# Patient Record
Sex: Female | Born: 1960
Health system: Southern US, Community
[De-identification: ages and names within clinical notes are randomized; demographics above are authoritative.]

## PROBLEM LIST (undated history)

## (undated) DIAGNOSIS — G43909 Migraine, unspecified, not intractable, without status migrainosus: Secondary | ICD-10-CM

## (undated) DIAGNOSIS — G44009 Cluster headache syndrome, unspecified, not intractable: Secondary | ICD-10-CM

## (undated) DIAGNOSIS — S83419A Sprain of medial collateral ligament of unspecified knee, initial encounter: Secondary | ICD-10-CM

## (undated) HISTORY — PX: ADENOIDECTOMY: SUR15

## (undated) HISTORY — PX: TONSILLECTOMY: SUR1361

## (undated) HISTORY — PX: OTHER SURGICAL HISTORY: SHX169

## (undated) HISTORY — DX: Cluster headache syndrome, unspecified, not intractable: G44.009

## (undated) HISTORY — DX: Migraine, unspecified, not intractable, without status migrainosus: G43.909

## (undated) HISTORY — PX: MOUTH SURGERY: SHX715

## (undated) HISTORY — PX: GALLBLADDER SURGERY: SHX652

## (undated) HISTORY — PX: CHOLECYSTECTOMY: SHX55

---

## 1998-12-18 ENCOUNTER — Encounter: Payer: Self-pay | Admitting: *Deleted

## 1998-12-18 ENCOUNTER — Encounter: Admission: RE | Admit: 1998-12-18 | Discharge: 1998-12-18 | Payer: Self-pay | Admitting: *Deleted

## 2007-08-13 ENCOUNTER — Emergency Department (HOSPITAL_COMMUNITY): Admission: EM | Admit: 2007-08-13 | Discharge: 2007-08-13 | Payer: Self-pay | Admitting: Emergency Medicine

## 2009-12-05 ENCOUNTER — Ambulatory Visit (HOSPITAL_COMMUNITY): Admission: RE | Admit: 2009-12-05 | Discharge: 2009-12-05 | Payer: Self-pay | Admitting: Urology

## 2010-01-06 ENCOUNTER — Ambulatory Visit (HOSPITAL_COMMUNITY)
Admission: RE | Admit: 2010-01-06 | Discharge: 2010-01-06 | Payer: Self-pay | Source: Home / Self Care | Attending: Internal Medicine | Admitting: Internal Medicine

## 2010-08-26 ENCOUNTER — Encounter: Payer: Self-pay | Admitting: Podiatrist

## 2010-08-26 DIAGNOSIS — M722 Plantar fascial fibromatosis: Secondary | ICD-10-CM | POA: Insufficient documentation

## 2010-08-26 DIAGNOSIS — G43909 Migraine, unspecified, not intractable, without status migrainosus: Secondary | ICD-10-CM | POA: Insufficient documentation

## 2011-08-28 ENCOUNTER — Emergency Department (HOSPITAL_COMMUNITY)
Admission: EM | Admit: 2011-08-28 | Discharge: 2011-08-29 | Disposition: A | Discharge status: Home Health | Payer: 59 | Attending: Emergency Medicine | Admitting: Emergency Medicine

## 2011-08-28 DIAGNOSIS — N390 Urinary tract infection, site not specified: Secondary | ICD-10-CM | POA: Insufficient documentation

## 2011-08-28 NOTE — ED Notes (Signed)
Pt states the last time she urinated was around 3 pm today. Pt denies any past medical hx of urinary complications.

## 2011-08-28 NOTE — ED Notes (Signed)
RN at bedside to perform bladder scan. Pt with only 64 cc of urine.

## 2011-08-28 NOTE — ED Provider Notes (Signed)
History     CSN: [id number]  Arrival date & time 08/28/11  2221   First MD Initiated Contact with Patient 08/28/11 2330      Chief Complaint  Patient presents with  . Urinary Retention  . Flank Pain    (Consider location/radiation/quality/duration/timing/severity/associated sxs/prior treatment) Patient is a 51 y.o. female presenting with flank pain. The history is provided by the patient.  Flank Pain  She states that she went to work this morning and went through her entire shift without urinating. This is somewhat unusual for her. Where should send it at 3 PM, she was able to urinate but it did not seem like it was a lot. Since then, she has had a sense that she has urinated has not been able to pass any urine. She's also developed intermittent, sharp, right suprapubic pain with some radiation to the right flank. There is no associated nausea, vomiting, diarrhea. She's not had any fever, chills, sweats. She did have a urinary tract infection one year ago but symptoms were just different. That time she had hematuria and not difficulty passing urine.  Past Medical History  Diagnosis Date  . Headaches, cluster   . Migraines     Past Surgical History  Procedure Date  . Gallbladder surgery   . Rotator cuff surgery   . Tonsillectomy   . Adenoidectomy   . Mouth surgery     No family history on file.  History  Substance Use Topics  . Smoking status: Unknown If Ever Smoked  . Smokeless tobacco: Not on file  . Alcohol Use: No    OB History    Grav Para Term Preterm Abortions TAB SAB Ect Mult Living                  Review of Systems  Genitourinary: Positive for flank pain.  All other systems reviewed and are negative.    Allergies  Sulfa antibiotics  Home Medications   Current Outpatient Rx  Name Route Sig Dispense Refill  . VITAMIN C CR PO Oral Take 1 tablet by mouth daily.     Marland Kitchen BIOTIN PO Oral Take 1 tablet by mouth daily.     Marland Kitchen CALCIUM + D PO Oral Take 1  tablet by mouth daily.     Marland Kitchen CETIRIZINE HCL 10 MG PO TABS Oral Take 10 mg by mouth daily.    . CHLORPROMAZINE HCL 25 MG PO TABS Oral Take 50 mg by mouth 4 (four) times daily.    . FUROSEMIDE 20 MG PO TABS Oral Take 20 mg by mouth as needed.    . IMIPRAMINE HCL PO Oral Take 75 mg by mouth daily.     . MULTIVITAMINS PO CAPS Oral Take 1 capsule by mouth daily.      Janetta Hora ESTRADIOL 1-35 MG-MCG PO TABS Oral Take 1 tablet by mouth daily.      BP 142/83  Pulse 113  Temp 97.8 F (36.6 C)  Resp 20  SpO2 99%  Physical Exam  Nursing note and vitals reviewed.  51 year old female who is resting comfortably and in no acute distress. Vital signs are significant for borderline hypertension with blood pressure 142/83, tachycardia with heart rate of 113. Oxygen saturation is 99% which is normal. Head is, cephalic and atraumatic. PERRLA, EOMI. Neck is nontender and supple. Back is nontender. Lungs are clear without rales, wheezes, rhonchi. Heart has regular rate rhythm without murmur. Abdomen is soft, flat, with mild suprapubic tenderness. There is no  physical evidence of distended bladder. There is no CVA tenderness. There no masses or hepatosplenomegaly. Extremities have no cyanosis or edema, full range of motion is present. Skin is warm and dry without rash. Neurologic: Mental status is normal, cranial nerves are intact, there are no motor or sensory deficits.  ED Course  Procedures (including critical care time)  Results for orders placed during the hospital encounter of 08/28/11  URINALYSIS, WITH MICROSCOPIC      Component Value Range   Color, Urine YELLOW  YELLOW   APPearance CLOUDY (*) CLEAR   Specific Gravity, Urine 1.024  1.005 - 1.030   pH 6.0  5.0 - 8.0   Glucose, UA NEGATIVE  NEGATIVE mg/dL   Hgb urine dipstick LARGE (*) NEGATIVE   Bilirubin Urine NEGATIVE  NEGATIVE   Ketones, ur TRACE (*) NEGATIVE mg/dL   Protein, ur 30 (*) NEGATIVE mg/dL   Urobilinogen, UA 0.2  0.0 - 1.0  mg/dL   Nitrite NEGATIVE  NEGATIVE   Leukocytes, UA NEGATIVE  NEGATIVE   WBC, UA 0-2  <3 WBC/hpf   RBC / HPF TOO NUMEROUS TO COUNT  <3 RBC/hpf   Bacteria, UA MANY (*) RARE   Squamous Epithelial / LPF RARE  RARE   Urine-Other MUCOUS PRESENT    POCT PREGNANCY, URINE      Component Value Range   Preg Test, Ur NEGATIVE  NEGATIVE    1. UTI (lower urinary tract infection)       MDM  Any area for 8 hours. I am suspicious that this is actually manifestation of a urinary tract infection. Foley catheter will be inserted to see how much urine she has actually produced in the last 8 hours, and specimen will be sent for urinalysis.  Urine residual was only about 200 mL. Urinalysis shows evidence of infection, so she will be started on antibiotics. Prescription is given for cephalexin and phenazopyridine. Urine has been sent for culture.      Dione Booze, MD 08/29/11 [phone]

## 2011-08-29 LAB — URINALYSIS, MICROSCOPIC ONLY
Glucose, UA: NEGATIVE mg/dL
Leukocytes, UA: NEGATIVE
Nitrite: NEGATIVE
Specific Gravity, Urine: 1.024 (ref 1.005–1.030)
pH: 6 (ref 5.0–8.0)

## 2011-08-29 MED ORDER — PHENAZOPYRIDINE HCL 200 MG PO TABS
200.0000 mg | ORAL_TABLET | Freq: Three times a day (TID) | ORAL | Status: DC
  Administered 2011-08-29: 200 mg via ORAL
  Filled 2011-08-29: qty 1

## 2011-08-29 MED ORDER — PHENAZOPYRIDINE HCL 200 MG PO TABS: 200.0000 mg | ORAL_TABLET | Freq: Three times a day (TID) | ORAL | Status: AC

## 2011-08-29 MED ORDER — CEPHALEXIN 500 MG PO CAPS
500.0000 mg | ORAL_CAPSULE | Freq: Once | ORAL | Status: AC
  Administered 2011-08-29: 500 mg via ORAL
  Filled 2011-08-29: qty 1

## 2011-08-29 MED ORDER — CEPHALEXIN 500 MG PO CAPS: 500.0000 mg | ORAL_CAPSULE | Freq: Three times a day (TID) | ORAL | Status: AC

## 2011-08-30 LAB — URINE CULTURE
Colony Count: NO GROWTH
Culture: NO GROWTH

## 2011-09-16 ENCOUNTER — Emergency Department (HOSPITAL_COMMUNITY)
Admission: EM | Admit: 2011-09-16 | Discharge: 2011-09-16 | Disposition: A | Discharge status: Home / Self Care | Payer: 59 | Source: Home / Self Care | Attending: Family Medicine | Admitting: Family Medicine

## 2011-09-16 ENCOUNTER — Encounter (HOSPITAL_COMMUNITY): Payer: Self-pay

## 2011-09-16 DIAGNOSIS — S90562A Insect bite (nonvenomous), left ankle, initial encounter: Secondary | ICD-10-CM

## 2011-09-16 DIAGNOSIS — T148XXA Other injury of unspecified body region, initial encounter: Secondary | ICD-10-CM

## 2011-09-16 DIAGNOSIS — S90569A Insect bite (nonvenomous), unspecified ankle, initial encounter: Secondary | ICD-10-CM

## 2011-09-16 MED ORDER — BETAMETHASONE DIPROPIONATE 0.05 % EX OINT: TOPICAL_OINTMENT | Freq: Two times a day (BID) | CUTANEOUS | Status: DC

## 2011-09-16 NOTE — ED Provider Notes (Signed)
History     CSN: [id number]  Arrival date & time 09/16/11  4098   First MD Initiated Contact with Patient 09/16/11 1910      Chief Complaint  Patient presents with  . Insect Bite    (Consider location/radiation/quality/duration/timing/severity/associated sxs/prior treatment) Patient is a 51 y.o. female presenting with rash. The history is provided by the patient.  Rash  This is a new problem. The current episode started 2 days ago. The problem has been gradually worsening. The problem is associated with an insect bite/sting. There has been no fever. The rash is present on the left lower leg. The pain is mild. Associated symptoms include itching and pain.    Past Medical History  Diagnosis Date  . Headaches, cluster   . Migraines     Past Surgical History  Procedure Date  . Gallbladder surgery   . Rotator cuff surgery   . Tonsillectomy   . Adenoidectomy   . Mouth surgery     History reviewed. No pertinent family history.  History  Substance Use Topics  . Smoking status: Unknown If Ever Smoked  . Smokeless tobacco: Not on file  . Alcohol Use: No    OB History    Grav Para Term Preterm Abortions TAB SAB Ect Mult Living                  Review of Systems  Constitutional: Negative.   Skin: Positive for itching and rash.    Allergies  Sulfa antibiotics  Home Medications   Current Outpatient Rx  Name Route Sig Dispense Refill  . VITAMIN C CR PO Oral Take 1 tablet by mouth daily.     Marland Kitchen BETAMETHASONE DIPROPIONATE 0.05 % EX OINT Topical Apply topically 2 (two) times daily. 50 g 0  . BIOTIN PO Oral Take 1 tablet by mouth daily.     Marland Kitchen CALCIUM + D PO Oral Take 1 tablet by mouth daily.     Marland Kitchen CETIRIZINE HCL 10 MG PO TABS Oral Take 10 mg by mouth daily.    . CHLORPROMAZINE HCL 25 MG PO TABS Oral Take 50 mg by mouth 4 (four) times daily.    . FUROSEMIDE 20 MG PO TABS Oral Take 20 mg by mouth as needed.    . IMIPRAMINE HCL PO Oral Take 75 mg by mouth daily.     .  MULTIVITAMINS PO CAPS Oral Take 1 capsule by mouth daily.      Janetta Hora ESTRADIOL 1-35 MG-MCG PO TABS Oral Take 1 tablet by mouth daily.      BP 162/96  Pulse 111  Temp 98.9 F (37.2 C) (Oral)  Resp 19  SpO2 97%  Physical Exam  Nursing note and vitals reviewed. Constitutional: She is oriented to person, place, and time. She appears well-developed and well-nourished.  Musculoskeletal: She exhibits no tenderness.  Neurological: She is alert and oriented to person, place, and time.  Skin: Skin is warm and dry. Rash noted. There is erythema.       Local erythema , no warmth, min tenderness to left post calf.assoc scattered papular lesions over ankle/ foot.    ED Course  Procedures (including critical care time)  Labs Reviewed - No data to display No results found.   1. Insect bite of ankle, left       MDM          Linna Hoff, MD 09/16/11 2018

## 2011-09-16 NOTE — ED Notes (Signed)
Was reportedly bitten on her left leg numerous times by fire ants 8-19; concern for area on calf

## 2012-03-12 ENCOUNTER — Other Ambulatory Visit: Payer: Self-pay

## 2012-03-18 ENCOUNTER — Emergency Department (HOSPITAL_COMMUNITY)
Admission: EM | Admit: 2012-03-18 | Discharge: 2012-03-18 | Disposition: A | Discharge status: Home / Self Care | Payer: 59 | Attending: Emergency Medicine | Admitting: Emergency Medicine

## 2012-03-18 ENCOUNTER — Emergency Department (HOSPITAL_COMMUNITY): Payer: 59

## 2012-03-18 ENCOUNTER — Encounter (HOSPITAL_COMMUNITY): Payer: Self-pay | Admitting: *Deleted

## 2012-03-18 DIAGNOSIS — S20219A Contusion of unspecified front wall of thorax, initial encounter: Secondary | ICD-10-CM | POA: Insufficient documentation

## 2012-03-18 DIAGNOSIS — Y92410 Unspecified street and highway as the place of occurrence of the external cause: Secondary | ICD-10-CM | POA: Insufficient documentation

## 2012-03-18 DIAGNOSIS — N390 Urinary tract infection, site not specified: Secondary | ICD-10-CM | POA: Insufficient documentation

## 2012-03-18 DIAGNOSIS — Z8669 Personal history of other diseases of the nervous system and sense organs: Secondary | ICD-10-CM | POA: Insufficient documentation

## 2012-03-18 DIAGNOSIS — Z79899 Other long term (current) drug therapy: Secondary | ICD-10-CM | POA: Insufficient documentation

## 2012-03-18 DIAGNOSIS — S0993XA Unspecified injury of face, initial encounter: Secondary | ICD-10-CM | POA: Insufficient documentation

## 2012-03-18 DIAGNOSIS — Z8679 Personal history of other diseases of the circulatory system: Secondary | ICD-10-CM | POA: Insufficient documentation

## 2012-03-18 DIAGNOSIS — Y9389 Activity, other specified: Secondary | ICD-10-CM | POA: Insufficient documentation

## 2012-03-18 DIAGNOSIS — S301XXA Contusion of abdominal wall, initial encounter: Secondary | ICD-10-CM

## 2012-03-18 DIAGNOSIS — Z3202 Encounter for pregnancy test, result negative: Secondary | ICD-10-CM | POA: Insufficient documentation

## 2012-03-18 LAB — CBC WITH DIFFERENTIAL/PLATELET
Basophils Absolute: 0 10*3/uL (ref 0.0–0.1)
Eosinophils Absolute: 0.1 10*3/uL (ref 0.0–0.7)
Eosinophils Relative: 2 % (ref 0–5)
Lymphs Abs: 1.9 10*3/uL (ref 0.7–4.0)
MCH: 30.6 pg (ref 26.0–34.0)
MCV: 89.9 fL (ref 78.0–100.0)
Neutrophils Relative %: 57 % (ref 43–77)
Platelets: 276 10*3/uL (ref 150–400)
RBC: 4.64 MIL/uL (ref 3.87–5.11)
RDW: 13.8 % (ref 11.5–15.5)
WBC: 6.3 10*3/uL (ref 4.0–10.5)

## 2012-03-18 LAB — POCT I-STAT, CHEM 8
HCT: 42 % (ref 36.0–46.0)
Hemoglobin: 14.3 g/dL (ref 12.0–15.0)
Potassium: 4 mEq/L (ref 3.5–5.1)
Sodium: 140 mEq/L (ref 135–145)
TCO2: 25 mmol/L (ref 0–100)

## 2012-03-18 LAB — URINALYSIS, ROUTINE W REFLEX MICROSCOPIC
Bilirubin Urine: NEGATIVE
Nitrite: NEGATIVE
Protein, ur: 30 mg/dL — AB
Specific Gravity, Urine: 1.019 (ref 1.005–1.030)
Urobilinogen, UA: 0.2 mg/dL (ref 0.0–1.0)

## 2012-03-18 LAB — URINE MICROSCOPIC-ADD ON

## 2012-03-18 LAB — POCT PREGNANCY, URINE: Preg Test, Ur: NEGATIVE

## 2012-03-18 MED ORDER — MORPHINE SULFATE 4 MG/ML IJ SOLN
4.0000 mg | Freq: Once | INTRAMUSCULAR | Status: AC
  Administered 2012-03-18: 4 mg via INTRAVENOUS
  Filled 2012-03-18: qty 1

## 2012-03-18 MED ORDER — SODIUM CHLORIDE 0.9 % IV SOLN
INTRAVENOUS | Status: DC
  Administered 2012-03-18 (×2): via INTRAVENOUS

## 2012-03-18 MED ORDER — HYDROCODONE-ACETAMINOPHEN 5-325 MG PO TABS: 2.0000 | ORAL_TABLET | ORAL | Status: DC | PRN

## 2012-03-18 MED ORDER — IOHEXOL 300 MG/ML  SOLN
100.0000 mL | Freq: Once | INTRAMUSCULAR | Status: AC | PRN
  Administered 2012-03-18: 100 mL via INTRAVENOUS

## 2012-03-18 MED ORDER — CIPROFLOXACIN HCL 500 MG PO TABS: 500.0000 mg | ORAL_TABLET | Freq: Two times a day (BID) | ORAL | Status: DC

## 2012-03-18 NOTE — ED Notes (Signed)
Patient transported to CT 
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
## 2012-03-19 LAB — URINE CULTURE: Colony Count: 6000

## 2012-12-01 ENCOUNTER — Other Ambulatory Visit: Payer: Self-pay

## 2013-04-04 ENCOUNTER — Ambulatory Visit: Payer: Self-pay | Admitting: Podiatry

## 2013-04-25 ENCOUNTER — Ambulatory Visit (INDEPENDENT_AMBULATORY_CARE_PROVIDER_SITE_OTHER): Payer: 59 | Admitting: Podiatry

## 2013-04-25 ENCOUNTER — Encounter: Payer: Self-pay | Admitting: Podiatry

## 2013-04-25 ENCOUNTER — Ambulatory Visit (INDEPENDENT_AMBULATORY_CARE_PROVIDER_SITE_OTHER): Payer: 59

## 2013-04-25 VITALS — Ht 64.0 in | Wt 215.0 lb

## 2013-04-25 DIAGNOSIS — M779 Enthesopathy, unspecified: Secondary | ICD-10-CM

## 2013-04-25 DIAGNOSIS — M79609 Pain in unspecified limb: Secondary | ICD-10-CM

## 2013-04-25 DIAGNOSIS — M7750 Other enthesopathy of unspecified foot: Secondary | ICD-10-CM

## 2013-04-25 DIAGNOSIS — M79672 Pain in left foot: Secondary | ICD-10-CM

## 2013-04-25 DIAGNOSIS — M778 Other enthesopathies, not elsewhere classified: Secondary | ICD-10-CM

## 2013-04-25 MED ORDER — METHYLPREDNISOLONE (PAK) 4 MG PO TABS: ORAL_TABLET | ORAL | Status: DC

## 2013-04-25 NOTE — Progress Notes (Signed)
   Subjective:    Patient ID: Taylor [name], female    DOB: [birth year], 53 y.o.   MRN: [id number]  HPI Comments: The foot really did not stop hurting since the last time here. Left foot ball of foot ,if on my feet a lot, its a constant pain.  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   Foot Pain      Review of Systems  All other systems reviewed and are negative.       Objective:   Physical Exam: I have reviewed her past medical history medications allergies surgeries social history. Pulses are strongly palpable bilateral. Neurologic sensorium is intact. Muscle strength is intact left foot + over 5 dorsiflexors plantar flexors inverters everters. All intrinsic musculature is intact. Orthopedic evaluation Mr. is all joints distal to the ankle a full range of motion without crepitation bilateral foot. Left foot does demonstrate pain on in range of motion of the second and third metatarsophalangeal joint of the left foot with an associated mild tender plantar medial calcaneus.        Assessment & Plan:  Assessment: Capsulitis of the second third metatarsophalangeal joint of the left foot. This is possibly associated or attributable to mild plantar fasciitis of the left heel which we did not treat today. I performed a periarticular injection about the second metatarsophalangeal joint of the left foot. This was performed with 20 mg of Kenalog and local anesthetic. I also wrote her prescription of Medrol to be followed by the [facility]. She was also scanned for a pair orthotics. I will followup with her when those come in.

## 2013-05-12 ENCOUNTER — Ambulatory Visit (INDEPENDENT_AMBULATORY_CARE_PROVIDER_SITE_OTHER): Payer: 59 | Admitting: *Deleted

## 2013-05-12 DIAGNOSIS — M7750 Other enthesopathy of unspecified foot: Secondary | ICD-10-CM

## 2013-05-12 DIAGNOSIS — M779 Enthesopathy, unspecified: Principal | ICD-10-CM

## 2013-05-12 DIAGNOSIS — M778 Other enthesopathies, not elsewhere classified: Secondary | ICD-10-CM

## 2013-05-12 NOTE — Patient Instructions (Signed)

## 2013-05-12 NOTE — Progress Notes (Signed)
   Subjective:    Patient ID: Taylor [name], female    DOB: 08/27/1960, 53 y.o.   MRN: [id number]  HPI PICK UP ORTHOTICS AND GIVEN INSTRUCTION.   Review of Systems     Objective:   Physical Exam        Assessment & Plan:

## 2014-06-21 ENCOUNTER — Ambulatory Visit (INDEPENDENT_AMBULATORY_CARE_PROVIDER_SITE_OTHER): Payer: 59 | Admitting: Family Medicine

## 2014-06-21 ENCOUNTER — Encounter: Payer: Self-pay | Admitting: Family Medicine

## 2014-06-21 VITALS — BP 118/84 | HR 112 | Ht 64.0 in | Wt 215.0 lb

## 2014-06-21 DIAGNOSIS — M6789 Other specified disorders of synovium and tendon, multiple sites: Secondary | ICD-10-CM | POA: Diagnosis not present

## 2014-06-21 DIAGNOSIS — M2550 Pain in unspecified joint: Secondary | ICD-10-CM | POA: Insufficient documentation

## 2014-06-21 DIAGNOSIS — M76829 Posterior tibial tendinitis, unspecified leg: Secondary | ICD-10-CM | POA: Insufficient documentation

## 2014-06-21 NOTE — Progress Notes (Signed)
[facility] [address] [place], [place] [address] Phone: [phone] Subjective:    I'm seeing this patient by the request  [facility], MD     CC: Back pain, knee pain, foot pain.  [id number] Taylor [name] is a 54 y.o. female coming in with complaint of multiple joint pain. Patient states it seems to stem from her feet. Patient states it seems to move up. Most of time though she has had significant for pain for years. 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 Patient has seen another provider before and does have custom orthotics but states that the seem to hurt her feet more. Patient states that by the end of the day she starts having more of a dull throbbing aching pain. Patient states that the back pain is more intermittent and but the foot pain seems to be more constant. Patient rates the severity of pain as 5 out of 10. Not affecting daily activities but makes it more considerably difficult to do activities on a regular basis. Patient would like to start to be more active and take care of herself but finds it difficult secondary to the discomfort that she has. Patient tries to avoid over-the-counter medications. Patient does not have any other prescription medications for breakthrough pain. Denies any fevers, chills, or any abnormal weight loss. No significant injury that cause this.    Past Medical History  Diagnosis Date  . Headaches, cluster   . Migraines    Past Surgical History  Procedure Laterality Date  . Gallbladder surgery    . Rotator cuff surgery    . Tonsillectomy    . Adenoidectomy    . Mouth surgery     History  Substance Use Topics  . Smoking status: Never Smoker   . Smokeless tobacco: Never Used  . Alcohol Use: No   Allergies  Allergen Reactions  . Bee Venom Hives  . Sulfa Antibiotics Hives   Current Outpatient Prescriptions on File Prior to Visit  Medication Sig Dispense Refill  . cetirizine (ZYRTEC) 10 MG  tablet Take 10 mg by mouth daily.    . chlorproMAZINE (THORAZINE) 25 MG tablet Take 25-50 mg by mouth daily as needed (migraines).     Marland Kitchen imipramine (TOFRANIL-PM) 100 MG capsule Take 100 mg by mouth at bedtime.    . norethindrone-ethinyl estradiol 1/35 (ORTHO-NOVUM, NORTREL,CYCLAFEM) tablet Take 1 tablet by mouth daily.     No current facility-administered medications on file prior to visit.    Past medical history, social, surgical and family history all reviewed in electronic medical record.   Review of Systems: No headache, visual changes, nausea, vomiting, diarrhea, constipation, dizziness, abdominal pain, skin rash, fevers, chills, night sweats, weight loss, swollen lymph nodes, body aches, joint swelling, muscle aches, chest pain, shortness of breath, mood changes.   Objective Blood pressure 118/84, pulse 112, height  (1.626 m), weight 215 lb (97.523 kg), SpO2 96 %.  General: No apparent distress alert and oriented x3 mood and affect normal, dressed appropriately.  HEENT: Pupils equal, extraocular movements intact  Respiratory: Patient's speak in full sentences and does not appear short of breath  Cardiovascular: No lower extremity edema, non tender, no erythema  Skin: Warm dry intact with no signs of infection or rash on extremities or on axial skeleton.  Abdomen: Soft nontender  Neuro: Cranial nerves II through XII are intact, neurovascularly intact in all extremities with 2+ DTRs and 2+ pulses.  Lymph: No lymphadenopathy  of posterior or anterior cervical chain or axillae bilaterally.  Gait normal with good balance and coordination.  MSK:  Non tender with full range of motion and good stability and symmetric strength and tone of shoulders, elbows, wrist, hip, knee and ankles bilaterally.  Back Exam:  Inspection: Unremarkable  Motion: Flexion 35 deg, Extension 25 deg, Side Bending to 35 deg bilaterally,  Rotation to 35 deg bilaterally  SLR laying: Negative  XSLR laying:  Negative  Palpable tenderness: Mild diffuse tenderness of the paraspinal musculature of the lumbar spine FABER: negative. Sensory change: Gross sensation intact to all lumbar and sacral dermatomes.  Reflexes: 2+ at both patellar tendons, 2+ at achilles tendons, Babinski's downgoing.  Strength at foot  Plantar-flexion: 5/5 Dorsi-flexion: 5/5 Eversion: 4/5 Inversion: 5/5  Leg strength  Quad: 5/5 Hamstring: 5/5 Hip flexor: 4/5 Hip abductors: 4/5  Gait unremarkable. Knee exam shows the patient does have a valgus deformity of the knees bilaterally.  Foot exam shows the patient does have severe pes planus bilaterally with significant posterior tibialis insufficiency bilaterally left greater than right. Over pronation of the hindfoot. Splaying between the second and first toes of the feet bilaterally.   Impression and Recommendations:     This case required medical decision making of moderate complexity.

## 2014-06-21 NOTE — Assessment & Plan Note (Signed)
Patient does have significant number of different joint pains. I do think that most of this is secondary to the alignment as well as patient's daily activities. We discussed proper shoewear, and patient will be set up for custom orthotics. We discussed home exercises as well as over-the-counter natural supplementations. Patient will try to make these different changes as well as encourage her to be more active and potentially weight loss. Patient come back and see me again in 3-4 weeks for further evaluation and treatment.

## 2014-06-21 NOTE — Progress Notes (Signed)
Pre visit review using our clinic review tool, if applicable. No additional management support is needed unless otherwise documented below in the visit note. 

## 2014-06-21 NOTE — Patient Instructions (Addendum)
Good to see you.  Ice 20 minutes 2 times daily. Usually after activity and before bed. Exercises 3 times a week. Focus on core strength.  Orthotics will be great.  Lace shoes differently  Good shoes with rigid bottom.  Dierdre HarnessKeen, Dansko, Merrell or New balance greater then 700. Vitamin D 2000 IU daily Turmeric 500mg  twice daily See me again in 3-4 weeks.

## 2014-07-04 ENCOUNTER — Ambulatory Visit: Payer: 59 | Admitting: Family Medicine

## 2014-07-06 ENCOUNTER — Encounter: Payer: Self-pay | Admitting: Family Medicine

## 2014-07-06 ENCOUNTER — Ambulatory Visit (INDEPENDENT_AMBULATORY_CARE_PROVIDER_SITE_OTHER): Payer: 59 | Admitting: Family Medicine

## 2014-07-06 DIAGNOSIS — M6789 Other specified disorders of synovium and tendon, multiple sites: Secondary | ICD-10-CM

## 2014-07-06 DIAGNOSIS — M76829 Posterior tibial tendinitis, unspecified leg: Secondary | ICD-10-CM

## 2014-07-06 NOTE — Assessment & Plan Note (Signed)
Patient was put in custom orthotics today. We discussed icing regimen and continuing the home exercises. Patient come back in 3-4 weeks for further evaluation. Please see patient instructions.

## 2014-07-06 NOTE — Patient Instructions (Signed)

## 2014-07-06 NOTE — Progress Notes (Signed)
Patient was fitted for a : standard, cushioned, semi-rigid orthotic. The orthotic was heated and afterward the patient was in a seated position and the orthotic molded. The patient was positioned in subtalar neutral position and 10 degrees of ankle dorsiflexion in a non-weight bearing stance. After completion of molding, patient did have orthotic management which included instructions on acclimating to the orthotics, signs of ill fit as well as care for the orthotic.  I spent 30 minutes fitting the patient for her orthotics, explaining signs of ill fit, normal wear patterns, care for the orthotics and transitioning between shoes.   The blank was ground to a stable position for weight bearing. Size: 8 (Comfort) Base: Carbon fiber Additional Posting and Padding: The following postings were fitted onto the molded orthotics to help maintain a talar neutral position - Wedge posting for transverse arch:  250/35 on left side     Silicone posting for longitudinal arch:   250/100 bilaterally    250/35 on left foot lateral post  The patient ambulated these, and they were very comfortable and supportive.

## 2014-07-12 ENCOUNTER — Encounter: Payer: Self-pay | Admitting: Family Medicine

## 2014-07-12 ENCOUNTER — Ambulatory Visit (INDEPENDENT_AMBULATORY_CARE_PROVIDER_SITE_OTHER): Payer: 59 | Admitting: Family Medicine

## 2014-07-12 VITALS — BP 120/76 | HR 121 | Ht 64.0 in | Wt 225.0 lb

## 2014-07-12 DIAGNOSIS — M7150 Other bursitis, not elsewhere classified, unspecified site: Secondary | ICD-10-CM

## 2014-07-12 DIAGNOSIS — G8929 Other chronic pain: Secondary | ICD-10-CM | POA: Insufficient documentation

## 2014-07-12 DIAGNOSIS — M549 Dorsalgia, unspecified: Secondary | ICD-10-CM

## 2014-07-12 DIAGNOSIS — M6789 Other specified disorders of synovium and tendon, multiple sites: Secondary | ICD-10-CM

## 2014-07-12 DIAGNOSIS — M76829 Posterior tibial tendinitis, unspecified leg: Secondary | ICD-10-CM

## 2014-07-12 DIAGNOSIS — M7050 Other bursitis of knee, unspecified knee: Secondary | ICD-10-CM | POA: Insufficient documentation

## 2014-07-12 NOTE — Progress Notes (Signed)
[name] [address] [place], [place] [address] Phone: [phone] Subjective:    I'm seeing this patient by the request  [facility], MD     CC: Back pain, knee pain, foot pain follow up  [id number] Taylor [name] is a 54 y.o. female coming in with complaint of multiple joint pain. Patient was seen previously and did have significant posterior tibialis insufficiency as well as a plantar fasciitis that is likely contributing to some of her other joint pains. Patient was put in custom orthotics and was given home exercises. Discussed icing regimen as well as over-the-counter natural supplementations. Patient states she is taking most of the vitamins as well as wearing the orthotics on a regular basis and has noticed some mild improvement patient states the thing that seems to be worse at this time is actually on left knee pain. Patient states that it seems to be worse after standing long amount of time walking. Patient states this seems to be just distal to the knee on the medial aspect. A tight sensation with a dull throbbing sensation. Rates the severity of 5 out of 10. No radiation down the leg. Patient states that her foot pain is much better as long as she wears the orthotics. Consider back pain is still there but is very manageable at this time. No new symptoms    Past Medical History  Diagnosis Date  . Headaches, cluster   . Migraines    Past Surgical History  Procedure Laterality Date  . Gallbladder surgery    . Rotator cuff surgery    . Tonsillectomy    . Adenoidectomy    . Mouth surgery     History  Substance Use Topics  . Smoking status: Never Smoker   . Smokeless tobacco: Never Used  . Alcohol Use: No   Allergies  Allergen Reactions  . Bee Venom Hives  . Sulfa Antibiotics Hives   Current Outpatient Prescriptions on File Prior to Visit  Medication Sig Dispense Refill  . cetirizine (ZYRTEC) 10 MG tablet Take 10 mg by mouth  daily.    . chlorproMAZINE (THORAZINE) 25 MG tablet Take 25-50 mg by mouth daily as needed (migraines).     Marland Kitchen imipramine (TOFRANIL-PM) 100 MG capsule Take 100 mg by mouth at bedtime.    . norethindrone-ethinyl estradiol 1/35 (ORTHO-NOVUM, NORTREL,CYCLAFEM) tablet Take 1 tablet by mouth daily.     No current facility-administered medications on file prior to visit.    Past medical history, social, surgical and family history all reviewed in electronic medical record.   Review of Systems: No headache, visual changes, nausea, vomiting, diarrhea, constipation, dizziness, abdominal pain, skin rash, fevers, chills, night sweats, weight loss, swollen lymph nodes, body aches, joint swelling, muscle aches, chest pain, shortness of breath, mood changes.   Objective Blood pressure 120/76, pulse 121, height 5\' 4"  (1.626 m), weight 225 lb (102.059 kg), SpO2 98 %.  General: No apparent distress alert and oriented x3 mood and affect normal, dressed appropriately.  HEENT: Pupils equal, extraocular movements intact  Respiratory: Patient's speak in full sentences and does not appear short of breath  Cardiovascular: No lower extremity edema, non tender, no erythema  Skin: Warm dry intact with no signs of infection or rash on extremities or on axial skeleton.  Abdomen: Soft nontender  Neuro: Cranial nerves II through XII are intact, neurovascularly intact in all extremities with 2+ DTRs and 2+ pulses.  Lymph: No lymphadenopathy of posterior or anterior cervical  chain or axillae bilaterally.  Gait normal with good balance and coordination.  MSK:  Non tender with full range of motion and good stability and symmetric strength and tone of shoulders, elbows, wrist, hip, knee and ankles bilaterally.  Back Exam:  Inspection: Unremarkable  Motion: Flexion 35 deg, Extension 25 deg, Side Bending to 35 deg bilaterally,  Rotation to 35 deg bilaterally  SLR laying: Negative  XSLR laying: Negative  Palpable tenderness:  Mild diffuse tenderness of the paraspinal musculature of the lumbar spine but better than previous exam. FABER: negative. Sensory change: Gross sensation intact to all lumbar and sacral dermatomes.  Reflexes: 2+ at both patellar tendons, 2+ at achilles tendons, Babinski's downgoing.  Strength at foot  Plantar-flexion: 5/5 Dorsi-flexion: 5/5 Eversion: 4/5 Inversion: 5/5  Leg strength  Quad: 5/5 Hamstring: 5/5 Hip flexor: 4/5 Hip abductors: 4/5  Gait unremarkable. Knee:left Normal to inspection with no erythema or effusion or obvious bony abnormalities. Palpation reveals the patient does have more pain over the pes anserine area ROM full in flexion and extension and lower leg rotation. Ligaments with solid consistent endpoints including ACL, PCL, LCL, MCL. Negative Mcmurray's, Apley's, and Thessalonian tests. Non painful patellar compression. Patellar glide with minimal crepitus. Patellar and quadriceps tendons unremarkable. Hamstring and quadriceps strength is normal.     Foot exam shows the patient does have severe pes planus bilaterally with significant posterior tibialis insufficiency bilaterally left greater than right. Over pronation of the hindfoot. Splaying between the second and first toes of the feet bilaterally.   Impression and Recommendations:     This case required medical decision making of moderate complexity.

## 2014-07-12 NOTE — Patient Instructions (Signed)
Good to see you Ice is your friend For the knee new hamstrings exercises.  pennsaid pinkie amount topically 2 times daily as needed.  Continue the orthotics they will take a little time.  Stay active See me again in 6 weeks.

## 2014-07-12 NOTE — Assessment & Plan Note (Signed)
Home exercise program given, topical iodine limit was given, icing protocol, we discussed changing the fulcrum of potential bracing. Patient will try to make these changes, neck in 6 weeks. If continued have pain we'll consider injection.

## 2014-07-12 NOTE — Progress Notes (Signed)
Pre visit review using our clinic review tool, if applicable. No additional management support is needed unless otherwise documented below in the visit note. 

## 2014-07-12 NOTE — Assessment & Plan Note (Signed)
Significant improved alignment from the shoes as well as new orthotics. We discussed icing regimen and home exercises. We discussed the exercises in greater detail today. We discussed how this will help with the alignment back as well. Patient and will come back again in 6 weeks for further evaluation and treatment.

## 2014-07-12 NOTE — Assessment & Plan Note (Signed)
Secondary to more of a chronic malalignment and poor core strength. Discussed with patient about home exercises and continuing the range of motion and staying active. Patient will try to make these different changes as well as then come back and see me again in 6 weeks. We may went to consider osteopathic manipulation for further workup depending on how patient is responding.

## 2014-08-23 ENCOUNTER — Ambulatory Visit: Payer: 59 | Admitting: Family Medicine

## 2014-08-29 ENCOUNTER — Ambulatory Visit (INDEPENDENT_AMBULATORY_CARE_PROVIDER_SITE_OTHER): Payer: 59 | Admitting: Family Medicine

## 2014-08-29 ENCOUNTER — Encounter: Payer: Self-pay | Admitting: Family Medicine

## 2014-08-29 VITALS — BP 106/82 | HR 108 | Ht 64.0 in | Wt 226.0 lb

## 2014-08-29 DIAGNOSIS — M7742 Metatarsalgia, left foot: Secondary | ICD-10-CM

## 2014-08-29 DIAGNOSIS — M7741 Metatarsalgia, right foot: Secondary | ICD-10-CM | POA: Diagnosis not present

## 2014-08-29 NOTE — Patient Instructions (Signed)
Lets try the change without the rubber ball.  OK to put it back if you notice worse ankle pain  Lets get back on the horse Exercises 3 times a week.  Ice after work to the foot at least.  pennsaid pinkie amount topically 2 times daily as needed.  See me again in 3 weeks.

## 2014-08-29 NOTE — Progress Notes (Signed)
[facility] [address] [place], [place] [address] Phone: [phone] Subjective:    CC: Left foot pain  [id number] Taylor [name] is a 54 y.o. female coming in with complaint of multiple joint pain. Patient was seen previously and did have significant posterior tibialis insufficiency as well as a plantar fasciitis that is likely contributing to some of her other joint pains. Patient was put in custom orthotics and was given home exercises. Discussed icing regimen as well as over-the-counter natural supplementations. Patient has stopped doing the exercises on a regular basis as well as icing. Patient is starting to have increasing pain again on the medial aspect of the foot. Patient states that the pain is starting to affect her work as well as her daily activities. Patient states that after she wears other shoes that it seems to feel little bit better. Patient is wondering if the orthotics are correct. States the pain seems to be more on the lateral aspect of the foot that has been on the ankle.    Past Medical History  Diagnosis Date  . Headaches, cluster   . Migraines    Past Surgical History  Procedure Laterality Date  . Gallbladder surgery    . Rotator cuff surgery    . Tonsillectomy    . Adenoidectomy    . Mouth surgery     History  Substance Use Topics  . Smoking status: Never Smoker   . Smokeless tobacco: Never Used  . Alcohol Use: No   Allergies  Allergen Reactions  . Bee Venom Hives  . Sulfa Antibiotics Hives   Current Outpatient Prescriptions on File Prior to Visit  Medication Sig Dispense Refill  . cetirizine (ZYRTEC) 10 MG tablet Take 10 mg by mouth daily.    . chlorproMAZINE (THORAZINE) 25 MG tablet Take 25-50 mg by mouth daily as needed (migraines).     Marland Kitchen imipramine (TOFRANIL-PM) 100 MG capsule Take 100 mg by mouth at bedtime.    . norethindrone-ethinyl estradiol 1/35 (ORTHO-NOVUM, NORTREL,CYCLAFEM) tablet Take 1 tablet  by mouth daily.     No current facility-administered medications on file prior to visit.    Past medical history, social, surgical and family history all reviewed in electronic medical record.   Review of Systems: No headache, visual changes, nausea, vomiting, diarrhea, constipation, dizziness, abdominal pain, skin rash, fevers, chills, night sweats, weight loss, swollen lymph nodes, body aches, joint swelling, muscle aches, chest pain, shortness of breath, mood changes.   Objective Blood pressure 106/82, pulse 108, height 5\' 4"  (1.626 m), weight 226 lb (102.513 kg), SpO2 95 %.  General: No apparent distress alert and oriented x3 mood and affect normal, dressed appropriately.  HEENT: Pupils equal, extraocular movements intact  Respiratory: Patient's speak in full sentences and does not appear short of breath  Cardiovascular: No lower extremity edema, non tender, no erythema  Skin: Warm dry intact with no signs of infection or rash on extremities or on axial skeleton.  Abdomen: Soft nontender  Neuro: Cranial nerves II through XII are intact, neurovascularly intact in all extremities with 2+ DTRs and 2+ pulses.  Lymph: No lymphadenopathy of posterior or anterior cervical chain or axillae bilaterally.  Gait normal with good balance and coordination.  MSK:  Non tender with full range of motion and good stability and symmetric strength and tone of shoulders, elbows, wrist, hip, knee and ankles bilaterally.  Back Exam:  Inspection: Unremarkable  Motion: Flexion 35 deg, Extension 25 deg, Side Bending  to 35 deg bilaterally,  Rotation to 35 deg bilaterally  SLR laying: Negative  XSLR laying: Negative  Palpable tenderness: Mild diffuse tenderness of the paraspinal musculature of the lumbar spine but better than previous exam. FABER: negative. Sensory change: Gross sensation intact to all lumbar and sacral dermatomes.  Reflexes: 2+ at both patellar tendons, 2+ at achilles tendons, Babinski's  downgoing.  Strength at foot  Plantar-flexion: 5/5 Dorsi-flexion: 5/5 Eversion: 4/5 Inversion: 5/5  Leg strength  Quad: 5/5 Hamstring: 5/5 Hip flexor: 4/5 Hip abductors: 4/5  Gait unremarkable. Knee:left Normal to inspection with no erythema or effusion or obvious bony abnormalities. Palpation reveals the patient does have more pain over the pes anserine area ROM full in flexion and extension and lower leg rotation. Ligaments with solid consistent endpoints including ACL, PCL, LCL, MCL. Negative Mcmurray's, Apley's, and Thessalonian tests. Non painful patellar compression. Patellar glide with minimal crepitus. Patellar and quadriceps tendons unremarkable. Hamstring and quadriceps strength is normal.     Foot exam shows the patient does have severe pes planus bilaterally with significant posterior tibialis insufficiency bilaterally left greater than right. Over pronation of the hindfoot. Splaying between the second and first toes of the feet bilaterally. Tenderness over the dorsal aspect of the foot mostly over the third and fourth metatarsals. No specific findings. No pain to percussion. Negative squeeze test.   Impression and Recommendations:     This case required medical decision making of moderate complexity.

## 2014-08-29 NOTE — Progress Notes (Signed)
Pre visit review using our clinic review tool, if applicable. No additional management support is needed unless otherwise documented below in the visit note. 

## 2014-08-29 NOTE — Assessment & Plan Note (Signed)
Patient does have some metatarsalgia both feet. We discussed icing regimen and doing the home exercises on a regular basis. We discussed doing the over-the-counter natural supplementations as well. Patient did have the medial post removed today that I'm hoping will be beneficial. Patient and will come back and see me again in 3 weeks to make sure that she is responding to the new treatment options.

## 2014-09-19 ENCOUNTER — Encounter: Payer: Self-pay | Admitting: Family Medicine

## 2014-09-19 ENCOUNTER — Other Ambulatory Visit (INDEPENDENT_AMBULATORY_CARE_PROVIDER_SITE_OTHER): Payer: 59

## 2014-09-19 ENCOUNTER — Ambulatory Visit (INDEPENDENT_AMBULATORY_CARE_PROVIDER_SITE_OTHER): Payer: 59 | Admitting: Family Medicine

## 2014-09-19 VITALS — BP 132/82 | HR 117 | Ht 64.0 in | Wt 227.0 lb

## 2014-09-19 DIAGNOSIS — M25562 Pain in left knee: Secondary | ICD-10-CM

## 2014-09-19 DIAGNOSIS — S83412A Sprain of medial collateral ligament of left knee, initial encounter: Secondary | ICD-10-CM

## 2014-09-19 DIAGNOSIS — S83419A Sprain of medial collateral ligament of unspecified knee, initial encounter: Secondary | ICD-10-CM | POA: Insufficient documentation

## 2014-09-19 NOTE — Progress Notes (Signed)
Pre visit review using our clinic review tool, if applicable. No additional management support is needed unless otherwise documented below in the visit note. 

## 2014-09-19 NOTE — Patient Instructions (Signed)
Good to see you I am sorry it looks like a MCL tear but not fully Ice 20 minutes 2 times daily. Usually after activity and before bed. Wear immobilizer day and night for the next 2 weeks See me then and we will start exercises and get you back to normal.  Will take 2-3 weeks.

## 2014-09-19 NOTE — Progress Notes (Signed)
[facility] [address] [place], [place] [address] Phone: [phone] Subjective:    CC: Left foot pain, left knee pain  BJY:NWGNFAOZHY Taylor [name] is a 54 y.o. female coming in with complaint of multiple joint pain. Patient was seen previously and did have significant posterior tibialis insufficiency as well as a plantar fasciitis.  Patient states unfortunately she continues to have the same pain. Maybe not as bad. Unable to tolerate the custom orthotics at this time. States though that this is not her main problem.  Patient states unfortunate she is having a left knee pain. Does not know of any injury. Started yesterday while she was at work. Patient states that she was unable to actually put a lot of weight on her leg. Today his minorly better but not significantly better. Maybe some mild instability. All the pain is on the medial aspect of the knee. Rates the severity of pain a 7 out of 10. Difficult to walk and do her job.    Past Medical History  Diagnosis Date  . Headaches, cluster   . Migraines    Past Surgical History  Procedure Laterality Date  . Gallbladder surgery    . Rotator cuff surgery    . Tonsillectomy    . Adenoidectomy    . Mouth surgery     Social History  Substance Use Topics  . Smoking status: Never Smoker   . Smokeless tobacco: Never Used  . Alcohol Use: No   Allergies  Allergen Reactions  . Bee Venom Hives  . Sulfa Antibiotics Hives   Current Outpatient Prescriptions on File Prior to Visit  Medication Sig Dispense Refill  . cetirizine (ZYRTEC) 10 MG tablet Take 10 mg by mouth daily.    . chlorproMAZINE (THORAZINE) 25 MG tablet Take 25-50 mg by mouth daily as needed (migraines).     Marland Kitchen imipramine (TOFRANIL-PM) 100 MG capsule Take 100 mg by mouth at bedtime.    . norethindrone-ethinyl estradiol 1/35 (ORTHO-NOVUM, NORTREL,CYCLAFEM) tablet Take 1 tablet by mouth daily.     No current facility-administered  medications on file prior to visit.    Past medical history, social, surgical and family history all reviewed in electronic medical record.   Review of Systems: No headache, visual changes, nausea, vomiting, diarrhea, constipation, dizziness, abdominal pain, skin rash, fevers, chills, night sweats, weight loss, swollen lymph nodes, body aches, joint swelling, muscle aches, chest pain, shortness of breath, mood changes.   Objective Blood pressure 132/82, pulse 117, height 5\' 4"  (1.626 m), weight 227 lb (102.967 kg), SpO2 97 %.  General: No apparent distress alert and oriented x3 mood and affect normal, dressed appropriately.  HEENT: Pupils equal, extraocular movements intact  Respiratory: Patient's speak in full sentences and does not appear short of breath  Cardiovascular: No lower extremity edema, non tender, no erythema  Skin: Warm dry intact with no signs of infection or rash on extremities or on axial skeleton.  Abdomen: Soft nontender  Neuro: Cranial nerves II through XII are intact, neurovascularly intact in all extremities with 2+ DTRs and 2+ pulses.  Lymph: No lymphadenopathy of posterior or anterior cervical chain or axillae bilaterally.  Gait normal with good balance and coordination.  MSK:  Non tender with full range of motion and good stability and symmetric strength and tone of shoulders, elbows, wrist, hip, knee and ankles bilaterally.   Knee:left Mild bruising noted over the medial aspect of the knee Palpation over the medial joint line ROM  full in flexion and extension and lower leg rotation. Mild laxity of the MCL but endpoint still noted. Significant pain with this testing. All other ligaments intact Negative Mcmurray's, Apley's, and Thessalonian tests. Non painful patellar compression. Patellar glide with minimal crepitus. Patellar and quadriceps tendons unremarkable. Hamstring and quadriceps strength is normal.   MSK US performed of: Left knee This study was  ordered, performed, and interpreted by Terrilee Files D.O.  Knee: All structures visualized. Anteromedial, anterolateral, posteromedial, and posterolateral menisci unremarkable without tearing, fraying, effusion, or displacement. Patellar Tendon unremarkable on long and transverse views without effusion. No abnormality of prepatellar bursa. Patient does have a 50% tear of the proximal aspect on the articular side of the MCL.Marland Kitchen  IMPRESSION:  Partial MCL tear   Foot exam shows the patient does have severe pes planus bilaterally with significant posterior tibialis insufficiency bilaterally left greater than right. Over pronation of the hindfoot. Splaying between the second and first toes of the feet bilaterally. Tenderness over the dorsal aspect of the foot mostly over the third and fourth metatarsals. No specific findings. No pain to percussion. Negative squeeze test.   Impression and Recommendations:     This case required medical decision making of moderate complexity.

## 2014-09-19 NOTE — Assessment & Plan Note (Signed)
Patient does have a large tear noted of the MCL. Seems to be fairly spontaneous sure there is traumatic with some of the bruising that is noted. Patient was put in a knee immobilizer and will do icing. Avoid significant twisting motions at this time. Patient and will come back again in 2 weeks for further evaluation. At that time if doing better we'll get her doing more range of motion.

## 2014-10-02 ENCOUNTER — Other Ambulatory Visit: Payer: Self-pay | Admitting: *Deleted

## 2014-10-02 ENCOUNTER — Encounter: Payer: Self-pay | Admitting: Family Medicine

## 2014-10-02 MED ORDER — IBUPROFEN-FAMOTIDINE 800-26.6 MG PO TABS: ORAL_TABLET | ORAL | Status: DC

## 2014-10-04 ENCOUNTER — Ambulatory Visit (INDEPENDENT_AMBULATORY_CARE_PROVIDER_SITE_OTHER): Payer: 59 | Admitting: Family Medicine

## 2014-10-04 ENCOUNTER — Other Ambulatory Visit (INDEPENDENT_AMBULATORY_CARE_PROVIDER_SITE_OTHER): Payer: 59

## 2014-10-04 ENCOUNTER — Encounter: Payer: Self-pay | Admitting: Family Medicine

## 2014-10-04 VITALS — BP 132/80 | HR 115 | Wt 226.0 lb

## 2014-10-04 DIAGNOSIS — S83412D Sprain of medial collateral ligament of left knee, subsequent encounter: Secondary | ICD-10-CM | POA: Diagnosis not present

## 2014-10-04 NOTE — Assessment & Plan Note (Signed)
This case with patient again at length. We discussed icing regimen as well as put patient in a hinged release that she will start wearing in the next 5 days and then wear daily. We discussed icing regimen. Patient will try topical anti-inflammatory and duexis. We discussed what activities to potentially doing which was potentially avoid. Patient given some range of motion exercises. We will avoid exercises that are too much strength. Patient and will come back and see me again in 2-3 weeks. If worsening symptoms we would need to consider advanced imaging. This would be to evaluate how large the tear is as well as the meniscal injury.  Spent  25 minutes with patient face-to-face and had greater than 50% of counseling including as described above in assessment and plan.

## 2014-10-04 NOTE — Patient Instructions (Signed)
Good to see you Ice is your friend 2 times daily Immobilizer daily still  Until after the weekend.  Start little exercises in 2 days 3 times a week If pain worsens then call and we will get MRI See me again in 3 weeks. If doing well we will start strength training.

## 2014-10-04 NOTE — Progress Notes (Signed)
[facility] [address] [place], [place] [address] Phone: [phone] Subjective:    CC: , left knee pain  BJY:NWGNFAOZHY Taylor [name] is a 54 y.o. female coming in with left knee pain.  was found to have an MCL tear. Patient has been in the immobilizer. Has been icing intermittently. States that the pain is about the same. If she does not wear the immobilizer it seems to be significantly worse. Denies any radiation down the leg or any numbness but states that it is very difficult to walk on a regular basis secondary to the discomfort.    Past Medical History  Diagnosis Date  . Headaches, cluster   . Migraines    Past Surgical History  Procedure Laterality Date  . Gallbladder surgery    . Rotator cuff surgery    . Tonsillectomy    . Adenoidectomy    . Mouth surgery     Social History  Substance Use Topics  . Smoking status: Never Smoker   . Smokeless tobacco: Never Used  . Alcohol Use: No   Allergies  Allergen Reactions  . Bee Venom Hives  . Sulfa Antibiotics Hives   Current Outpatient Prescriptions on File Prior to Visit  Medication Sig Dispense Refill  . cetirizine (ZYRTEC) 10 MG tablet Take 10 mg by mouth daily.    . chlorproMAZINE (THORAZINE) 25 MG tablet Take 25-50 mg by mouth daily as needed (migraines).     . Ibuprofen-Famotidine 800-26.6 MG TABS Take 1 tablet 3 times daily as needed. 270 tablet 1  . imipramine (TOFRANIL-PM) 100 MG capsule Take 100 mg by mouth at bedtime.    . norethindrone-ethinyl estradiol 1/35 (ORTHO-NOVUM, NORTREL,CYCLAFEM) tablet Take 1 tablet by mouth daily.     No current facility-administered medications on file prior to visit.    Past medical history, social, surgical and family history all reviewed in electronic medical record.   Review of Systems: No headache, visual changes, nausea, vomiting, diarrhea, constipation, dizziness, abdominal pain, skin rash, fevers, chills, night sweats, weight  loss, swollen lymph nodes, body aches, joint swelling, muscle aches, chest pain, shortness of breath, mood changes.   Objective Blood pressure 132/80, pulse 115, weight 226 lb (102.513 kg), SpO2 96 %.  General: No apparent distress alert and oriented x3 mood and affect normal, dressed appropriately.  HEENT: Pupils equal, extraocular movements intact  Respiratory: Patient's speak in full sentences and does not appear short of breath  Cardiovascular: No lower extremity edema, non tender, no erythema  Skin: Warm dry intact with no signs of infection or rash on extremities or on axial skeleton.  Abdomen: Soft nontender  Neuro: Cranial nerves II through XII are intact, neurovascularly intact in all extremities with 2+ DTRs and 2+ pulses.  Lymph: No lymphadenopathy of posterior or anterior cervical chain or axillae bilaterally.  Gait normal with good balance and coordination.  MSK:  Non tender with full range of motion and good stability and symmetric strength and tone of shoulders, elbows, wrist, hip, knee and ankles bilaterally.   Knee:left Palpation over the medial joint line ROM full in flexion and extension and lower leg rotation. Mild laxity of the MCL but endpoint still noted. Significant pain with this testing. All other ligaments intact no significant change from previous exam Positive Mcmurray's, Apley's, and Thessalonian tests. Finding Non painful patellar compression. Patellar glide with minimal crepitus. Patellar and quadriceps tendons unremarkable. Hamstring and quadriceps strength is normal.   MSK US performed of: Left  knee This study was ordered, performed, and interpreted by Terrilee Files D.O.  Knee: All structures visualized. Questionable displacement of the anterior medial meniscus near the insertion of the MCL Patellar Tendon unremarkable on long and transverse views without effusion. No abnormality of prepatellar bursa. Patient does have a 40% tear of the proximal aspect  on the articular side of the MCL. We just moderately improved from the 50% tear seen previously.  IMPRESSION:  Partial MCL tear with minimal improvement    Impression and Recommendations:     This case required medical decision making of moderate complexity.

## 2014-10-06 ENCOUNTER — Emergency Department (HOSPITAL_COMMUNITY): Payer: 59

## 2014-10-06 ENCOUNTER — Encounter (HOSPITAL_COMMUNITY): Payer: Self-pay | Admitting: *Deleted

## 2014-10-06 ENCOUNTER — Emergency Department (HOSPITAL_COMMUNITY)
Admission: EM | Admit: 2014-10-06 | Discharge: 2014-10-06 | Disposition: A | Discharge status: Home / Self Care | Payer: 59 | Attending: Emergency Medicine | Admitting: Emergency Medicine

## 2014-10-06 DIAGNOSIS — S8992XA Unspecified injury of left lower leg, initial encounter: Secondary | ICD-10-CM | POA: Insufficient documentation

## 2014-10-06 DIAGNOSIS — M25562 Pain in left knee: Secondary | ICD-10-CM

## 2014-10-06 DIAGNOSIS — X58XXXA Exposure to other specified factors, initial encounter: Secondary | ICD-10-CM | POA: Insufficient documentation

## 2014-10-06 DIAGNOSIS — Y9301 Activity, walking, marching and hiking: Secondary | ICD-10-CM | POA: Insufficient documentation

## 2014-10-06 DIAGNOSIS — Y990 Civilian activity done for income or pay: Secondary | ICD-10-CM | POA: Diagnosis not present

## 2014-10-06 DIAGNOSIS — Z79899 Other long term (current) drug therapy: Secondary | ICD-10-CM | POA: Insufficient documentation

## 2014-10-06 DIAGNOSIS — G43909 Migraine, unspecified, not intractable, without status migrainosus: Secondary | ICD-10-CM | POA: Insufficient documentation

## 2014-10-06 DIAGNOSIS — Y9289 Other specified places as the place of occurrence of the external cause: Secondary | ICD-10-CM | POA: Insufficient documentation

## 2014-10-06 HISTORY — DX: Sprain of medial collateral ligament of unspecified knee, initial encounter: S83.419A

## 2014-10-06 MED ORDER — OXYCODONE-ACETAMINOPHEN 5-325 MG PO TABS
1.0000 | ORAL_TABLET | Freq: Four times a day (QID) | ORAL | Status: DC | PRN
Start: 2014-10-06 — End: 2015-04-17

## 2014-10-06 MED ORDER — OXYCODONE-ACETAMINOPHEN 5-325 MG PO TABS
2.0000 | ORAL_TABLET | Freq: Once | ORAL | Status: AC
  Administered 2014-10-06: 2 via ORAL
  Filled 2014-10-06: qty 2

## 2014-10-06 NOTE — Discharge Instructions (Signed)
Cryotherapy Cryotherapy is when you put ice on your injury. Ice helps lessen pain and puffiness (swelling) after an injury. Ice works the best when you start using it in the first 24 to 48 hours after an injury. HOME CARE  Put a dry or damp towel between the ice pack and your skin.  You may press gently on the ice pack.  Leave the ice on for no more than 10 to 20 minutes at a time.  Check your skin after 5 minutes to make sure your skin is okay.  Rest at least 20 minutes between ice pack uses.  Stop using ice when your skin loses feeling (numbness).  Do not use ice on someone who cannot tell you when it hurts. This includes small children and people with memory problems (dementia). GET HELP RIGHT AWAY IF:  You have white spots on your skin.  Your skin turns blue or pale.  Your skin feels waxy or hard.  Your puffiness gets worse. MAKE SURE YOU:   Understand these instructions.  Will watch your condition.  Will get help right away if you are not doing well or get worse. Document Released: 07/01/2007 Document Revised: 04/06/2011 Document Reviewed: 09/04/2010 Exit[employer] Patient Information 2015 [employer]. This information is not intended to replace advice given to you by your health care provider. Make sure you discuss any questions you have with your health care provider.  

## 2014-10-06 NOTE — ED Notes (Signed)
Pt reports while leaving work this evening she heard/felt a pop in her left knee and immediately began experiencing left knee pain - pt w/ hx of MCL tear to which pt sees Antoine Primas at [facility].

## 2014-10-06 NOTE — ED Provider Notes (Signed)
CSN: [id number]     Arrival date & time 10/06/14  1949 History  This chart was scribed for Earley Favor, NP, working with Laurence Spates, MD by Chestine Spore, ED Scribe. The patient was seen in room WTR8/WTR8 at 8:27 PM.    Chief Complaint  Patient presents with  . Knee Pain      The history is provided by the patient. No language interpreter was used.    Taylor [name] is a 54 y.o. female with a medical hx of MCL tear, who presents to the Emergency Department complaining of left knee pain onset tonight. She reports that she was leaving work this evening when she was walking when she heard a pop. She notes that she began to immediately have pain to the area following the pop. Pt has a PMHx of MCL tear that she is followed by Antoine Primas at [facility]. Pt reports that she was wearing her knee sleeve with an ace wrap that she was informed to wear through the weekend and if the pain worsened there will be an MRI of it this morning. Pt notes that she initially tore her MCL while working and it is a 50% tear. She notes that she has tried ibuprofen with no relief for her symptoms. She denies color change, wound, rash, joint swelling, and any other symptoms.    Past Medical History  Diagnosis Date  . Headaches, cluster   . Migraines   . Tear of MCL (medial collateral ligament) of knee    Past Surgical History  Procedure Laterality Date  . Gallbladder surgery    . Rotator cuff surgery    . Tonsillectomy    . Adenoidectomy    . Mouth surgery     History reviewed. No pertinent family history. Social History  Substance Use Topics  . Smoking status: Never Smoker   . Smokeless tobacco: Never Used  . Alcohol Use: No   OB History    No data available     Review of Systems  Musculoskeletal: Positive for arthralgias and gait problem. Negative for joint swelling.  Skin: Negative for color change, rash and wound.      Allergies  Bee venom and Sulfa antibiotics  Home  Medications   Prior to Admission medications   Medication Sig Start Date End Date Taking? Authorizing Provider  cetirizine (ZYRTEC) 10 MG tablet Take 10 mg by mouth daily.    Historical Provider, MD  chlorproMAZINE (THORAZINE) 25 MG tablet Take 25-50 mg by mouth daily as needed (migraines).     Historical Provider, MD  Ibuprofen-Famotidine 800-26.6 MG TABS Take 1 tablet 3 times daily as needed. 10/02/14   Judi Saa, DO  imipramine (TOFRANIL-PM) 100 MG capsule Take 100 mg by mouth at bedtime.    Historical Provider, MD  norethindrone-ethinyl estradiol 1/35 (ORTHO-NOVUM, NORTREL,CYCLAFEM) tablet Take 1 tablet by mouth daily.    Historical Provider, MD   BP 176/90 mmHg  Pulse 103  Temp(Src) 98 F (36.7 C) (Oral)  Resp 20  SpO2 99% Physical Exam  Constitutional: She is oriented to person, place, and time. She appears well-developed and well-nourished. No distress.  HENT:  Head: Normocephalic and atraumatic.  Eyes: EOM are normal.  Neck: Neck supple. No tracheal deviation present.  Cardiovascular: Normal rate.   Pulmonary/Chest: Effort normal. No respiratory distress.  Musculoskeletal: Normal range of motion.  Full ROM of left foot/ankle.   Neurological: She is alert and oriented to person, place, and time.  Skin: Skin is  warm and dry.  Psychiatric: She has a normal mood and affect. Her behavior is normal.  Nursing note and vitals reviewed.   ED Course  Procedures (including critical care time) DIAGNOSTIC STUDIES: Oxygen Saturation is 96% on RA, nl by my interpretation.    COORDINATION OF CARE: 8:29 PM Discussed treatment plan with pt at bedside and pt agreed to plan.   Labs Review Labs Reviewed - No data to display  Imaging Review Dg Knee Complete 4 Views Left  10/06/2014   CLINICAL DATA:  Acute onset left knee pain today. Heard a pop. Medial and anterior pain. Swelling. Initial encounter.  EXAM: LEFT KNEE - COMPLETE 4+ VIEW  COMPARISON:  None.  FINDINGS: There is no  evidence of fracture, dislocation, or joint effusion. There is no evidence of arthropathy or other focal bone abnormality. Soft tissues are unremarkable.  IMPRESSION: Negative.   Electronically Signed   By: Sebastian Ache M.D.   On: 10/06/2014 21:37   I have personally reviewed and evaluated these images  as part of my medical decision-making.   EKG Interpretation None      MDM   Final diagnoses:  None    I personally performed the services described in this documentation, which was scribed in my presence. The recorded information has been reviewed and is accurate.  Earley Favor, NP 10/06/14 2209  Laurence Spates, MD 10/06/14 [phone]

## 2014-10-08 ENCOUNTER — Telehealth: Payer: Self-pay | Admitting: Family Medicine

## 2014-10-08 DIAGNOSIS — M25562 Pain in left knee: Secondary | ICD-10-CM

## 2014-10-08 NOTE — Telephone Encounter (Signed)
Patient states she went to [facility] ER over the weekend.  She states she can not walk and needs an MRI.  She is requesting a call back as soon as possible.

## 2014-10-08 NOTE — Telephone Encounter (Signed)
Spoke with pt, entered MRI.

## 2014-10-09 ENCOUNTER — Other Ambulatory Visit: Payer: Self-pay | Admitting: *Deleted

## 2014-10-09 DIAGNOSIS — M25562 Pain in left knee: Secondary | ICD-10-CM

## 2014-10-12 ENCOUNTER — Other Ambulatory Visit: Payer: Self-pay | Admitting: Orthopaedic Surgery

## 2014-10-12 ENCOUNTER — Encounter (HOSPITAL_BASED_OUTPATIENT_CLINIC_OR_DEPARTMENT_OTHER): Payer: Self-pay | Admitting: *Deleted

## 2014-10-17 NOTE — H&P (Signed)
Taylor [name] is an 54 y.o. female.   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 She is here in referral through Dr Ayesha Mohair about her left knee. About 3 weeks ago she saw Dr Katrinka Blazing and was diagnosed with an MCL injury. Unfortunately last week leaving work her knee popped and things got much worse. She underwent an MRI scan which has shown a displaced medial meniscus tear. At this point she is requiring 2 crutches and a brace. She is out of work due to her discomfort. Her pain is constant and severe. She cannot sleep. She wonders what she can do to affect a change.   Imaging/Tests: I reviewed an MRI scan films and report of a study done on 10/08/14 at [facility]. This shows a displaced bucket-handle tear the medial meniscus. I also reviewed some plain films online done on 10/06/14 which look normal.  Past Medical History  Diagnosis Date  . Headaches, cluster   . Migraines   . Tear of MCL (medial collateral ligament) of knee     Past Surgical History  Procedure Laterality Date  . Gallbladder surgery    . Tonsillectomy    . Adenoidectomy    . Mouth surgery    . Rotator cuff surgery Right   . Cholecystectomy      History reviewed. No pertinent family history. Social History:  reports that she has never smoked. She has never used smokeless tobacco. She reports that she does not drink alcohol or use illicit drugs.  Allergies:  Allergies  Allergen Reactions  . Bee Venom Hives  . Sulfa Antibiotics Hives    No prescriptions prior to admission    No results found for this or any previous visit (from the past 48 hour(s)). No results found.  Review of Systems  Musculoskeletal: Positive for joint pain.       Left knee  All other systems reviewed and are negative.   Height  (1.626 m), weight 102.059 kg (225 lb). Physical Exam  Constitutional: She is oriented to person, place, and time. She appears well-developed and  well-nourished.  HENT:  Head: Normocephalic and atraumatic.  Neck: Normal range of motion.  Cardiovascular: Normal rate and regular rhythm.   Respiratory: Effort normal.  GI: Soft.  Musculoskeletal:  Left knee has trace effusion. Her motion is about 0-100 at which point she has some terrible pain. She has medial joint line pain and McMurray's test which is positive for a pop and pain. Ligaments are stable.  Hip motion is full and pain free and SLR is negative on both sides.  There is no palpable LAD behind either knee.  Sensation and motor function are intact on both sides and there are palpable pulses on both sides.  Neurological: She is alert and oriented to person, place, and time.  Skin: Skin is warm and dry.  Psychiatric: She has a normal mood and affect. Her behavior is normal. Judgment and thought content normal.     Assessment/Plan Assessment:   Left knee torn medial meniscus by MRI  Plan: Taylor [name] has a completely displaced meniscal tear. She has disabling pain at this point and cannot walk stably and cannot obviously work. I reviewed risk of anesthesia, infection, DVT related to a knee arthroscopy. I reviewed 3 or 4 weeks for recovery after this outpatient operation and stressed the importance of some postoperative physical therapy.  NIDA, Ginger Organ 10/17/2014, 12:12 PM

## 2014-10-18 ENCOUNTER — Ambulatory Visit (HOSPITAL_BASED_OUTPATIENT_CLINIC_OR_DEPARTMENT_OTHER): Payer: 59 | Admitting: Anesthesiology

## 2014-10-18 ENCOUNTER — Encounter (HOSPITAL_BASED_OUTPATIENT_CLINIC_OR_DEPARTMENT_OTHER)
Admission: RE | Disposition: A | Discharge status: Home / Self Care | Payer: Self-pay | Source: Ambulatory Visit | Attending: Orthopaedic Surgery

## 2014-10-18 ENCOUNTER — Encounter (HOSPITAL_BASED_OUTPATIENT_CLINIC_OR_DEPARTMENT_OTHER): Payer: Self-pay | Admitting: Anesthesiology

## 2014-10-18 ENCOUNTER — Ambulatory Visit (HOSPITAL_BASED_OUTPATIENT_CLINIC_OR_DEPARTMENT_OTHER)
Admission: RE | Admit: 2014-10-18 | Discharge: 2014-10-18 | Disposition: A | Discharge status: Home / Self Care | Payer: 59 | Source: Ambulatory Visit | Attending: Orthopaedic Surgery | Admitting: Orthopaedic Surgery

## 2014-10-18 DIAGNOSIS — M23304 Other meniscus derangements, unspecified medial meniscus, left knee: Secondary | ICD-10-CM | POA: Insufficient documentation

## 2014-10-18 DIAGNOSIS — M94262 Chondromalacia, left knee: Secondary | ICD-10-CM | POA: Insufficient documentation

## 2014-10-18 HISTORY — PX: CHONDROPLASTY: SHX5177

## 2014-10-18 HISTORY — PX: KNEE ARTHROSCOPY WITH MEDIAL MENISECTOMY: SHX5651

## 2014-10-18 SURGERY — ARTHROSCOPY, KNEE, WITH MEDIAL MENISCECTOMY
Anesthesia: General | Site: Knee | Laterality: Left

## 2014-10-18 MED ORDER — BUPIVACAINE-EPINEPHRINE 0.5% -1:200000 IJ SOLN
INTRAMUSCULAR | Status: DC | PRN
  Administered 2014-10-18: 20 mL

## 2014-10-18 MED ORDER — METHYLPREDNISOLONE ACETATE 80 MG/ML IJ SUSP
INTRAMUSCULAR | Status: AC
  Filled 2014-10-18: qty 2

## 2014-10-18 MED ORDER — LACTATED RINGERS IV SOLN
Freq: Once | INTRAVENOUS | Status: AC
  Administered 2014-10-18: 1000 mL via INTRAVENOUS

## 2014-10-18 MED ORDER — FENTANYL CITRATE (PF) 100 MCG/2ML IJ SOLN
50.0000 ug | INTRAMUSCULAR | Status: AC | PRN
  Administered 2014-10-18 (×4): 50 ug via INTRAVENOUS

## 2014-10-18 MED ORDER — FENTANYL CITRATE (PF) 100 MCG/2ML IJ SOLN
INTRAMUSCULAR | Status: AC
  Filled 2014-10-18: qty 2

## 2014-10-18 MED ORDER — BUPIVACAINE HCL (PF) 0.5 % IJ SOLN
INTRAMUSCULAR | Status: AC
  Filled 2014-10-18: qty 60

## 2014-10-18 MED ORDER — MIDAZOLAM HCL 2 MG/2ML IJ SOLN
INTRAMUSCULAR | Status: AC
Start: 2014-10-18 — End: 2014-10-18
  Filled 2014-10-18: qty 4

## 2014-10-18 MED ORDER — FENTANYL CITRATE (PF) 100 MCG/2ML IJ SOLN
INTRAMUSCULAR | Status: AC
  Filled 2014-10-18: qty 4

## 2014-10-18 MED ORDER — LACTATED RINGERS IV SOLN
INTRAVENOUS | Status: DC
  Administered 2014-10-18: 13:00:00 via INTRAVENOUS

## 2014-10-18 MED ORDER — KETOROLAC TROMETHAMINE 30 MG/ML IJ SOLN
INTRAMUSCULAR | Status: AC
  Filled 2014-10-18: qty 1

## 2014-10-18 MED ORDER — PHENYLEPHRINE HCL 10 MG/ML IJ SOLN
INTRAMUSCULAR | Status: DC | PRN
  Administered 2014-10-18 (×2): 40 ug via INTRAVENOUS

## 2014-10-18 MED ORDER — LIDOCAINE HCL (CARDIAC) 20 MG/ML IV SOLN
INTRAVENOUS | Status: DC | PRN
  Administered 2014-10-18: 50 mg via INTRAVENOUS

## 2014-10-18 MED ORDER — PROPOFOL 10 MG/ML IV BOLUS
INTRAVENOUS | Status: AC
  Filled 2014-10-18: qty 20

## 2014-10-18 MED ORDER — PROMETHAZINE HCL 25 MG/ML IJ SOLN: 6.2500 mg | INTRAMUSCULAR | Status: DC | PRN

## 2014-10-18 MED ORDER — GLYCOPYRROLATE 0.2 MG/ML IJ SOLN: 0.2000 mg | Freq: Once | INTRAMUSCULAR | Status: DC | PRN

## 2014-10-18 MED ORDER — SODIUM CHLORIDE 0.9 % IR SOLN
Status: DC | PRN
  Administered 2014-10-18: 2000 mL

## 2014-10-18 MED ORDER — MORPHINE SULFATE (PF) 4 MG/ML IV SOLN
INTRAVENOUS | Status: DC | PRN
  Administered 2014-10-18: 4 mg via INTRAVENOUS

## 2014-10-18 MED ORDER — DEXAMETHASONE SODIUM PHOSPHATE 4 MG/ML IJ SOLN
INTRAMUSCULAR | Status: DC | PRN
  Administered 2014-10-18: 10 mg via INTRAVENOUS

## 2014-10-18 MED ORDER — LACTATED RINGERS IV SOLN
INTRAVENOUS | Status: DC
  Administered 2014-10-18 (×3): via INTRAVENOUS

## 2014-10-18 MED ORDER — HYDROCODONE-ACETAMINOPHEN 5-325 MG PO TABS: 1.0000 | ORAL_TABLET | Freq: Four times a day (QID) | ORAL | Status: DC | PRN

## 2014-10-18 MED ORDER — KETOROLAC TROMETHAMINE 30 MG/ML IJ SOLN
INTRAMUSCULAR | Status: DC | PRN
  Administered 2014-10-18: 30 mg via INTRAVENOUS

## 2014-10-18 MED ORDER — CHLORHEXIDINE GLUCONATE 4 % EX LIQD: 60.0000 mL | Freq: Once | CUTANEOUS | Status: DC

## 2014-10-18 MED ORDER — FENTANYL CITRATE (PF) 100 MCG/2ML IJ SOLN
25.0000 ug | INTRAMUSCULAR | Status: DC | PRN
  Administered 2014-10-18: 50 ug via INTRAVENOUS
  Administered 2014-10-18: 25 ug via INTRAVENOUS

## 2014-10-18 MED ORDER — OXYCODONE HCL 5 MG PO TABS
ORAL_TABLET | ORAL | Status: AC
  Filled 2014-10-18: qty 1

## 2014-10-18 MED ORDER — ONDANSETRON HCL 4 MG/2ML IJ SOLN
INTRAMUSCULAR | Status: AC
  Filled 2014-10-18: qty 2

## 2014-10-18 MED ORDER — OXYCODONE HCL 5 MG/5ML PO SOLN: 5.0000 mg | Freq: Once | ORAL | Status: AC | PRN

## 2014-10-18 MED ORDER — LIDOCAINE HCL (CARDIAC) 20 MG/ML IV SOLN
INTRAVENOUS | Status: AC
  Filled 2014-10-18: qty 5

## 2014-10-18 MED ORDER — BUPIVACAINE-EPINEPHRINE (PF) 0.5% -1:200000 IJ SOLN
INTRAMUSCULAR | Status: AC
  Filled 2014-10-18: qty 30

## 2014-10-18 MED ORDER — CEFAZOLIN SODIUM-DEXTROSE 2-3 GM-% IV SOLR
INTRAVENOUS | Status: AC
  Filled 2014-10-18: qty 50

## 2014-10-18 MED ORDER — SCOPOLAMINE 1 MG/3DAYS TD PT72: 1.0000 | MEDICATED_PATCH | Freq: Once | TRANSDERMAL | Status: DC | PRN

## 2014-10-18 MED ORDER — OXYCODONE HCL 5 MG PO TABS
5.0000 mg | ORAL_TABLET | Freq: Once | ORAL | Status: AC | PRN
  Administered 2014-10-18: 5 mg via ORAL

## 2014-10-18 MED ORDER — MIDAZOLAM HCL 2 MG/2ML IJ SOLN
1.0000 mg | INTRAMUSCULAR | Status: DC | PRN
  Administered 2014-10-18: 2 mg via INTRAVENOUS

## 2014-10-18 MED ORDER — MORPHINE SULFATE (PF) 4 MG/ML IV SOLN
INTRAVENOUS | Status: AC
  Filled 2014-10-18: qty 1

## 2014-10-18 MED ORDER — PROPOFOL 10 MG/ML IV BOLUS
INTRAVENOUS | Status: DC | PRN
Start: 2014-10-18 — End: 2014-10-18
  Administered 2014-10-18: 200 mg via INTRAVENOUS
  Administered 2014-10-18 (×2): 50 mg via INTRAVENOUS

## 2014-10-18 MED ORDER — CEFAZOLIN SODIUM-DEXTROSE 2-3 GM-% IV SOLR
2.0000 g | INTRAVENOUS | Status: AC
Start: 2014-10-19 — End: 2014-10-18
  Administered 2014-10-18: 2 g via INTRAVENOUS

## 2014-10-18 MED ORDER — DEXAMETHASONE SODIUM PHOSPHATE 10 MG/ML IJ SOLN
INTRAMUSCULAR | Status: AC
  Filled 2014-10-18: qty 1

## 2014-10-18 SURGICAL SUPPLY — 38 items
BANDAGE ELASTIC 6 VELCRO ST LF (GAUZE/BANDAGES/DRESSINGS) ×2 IMPLANT
BLADE CUDA 5.5 (BLADE) IMPLANT
BLADE CUTTER GATOR 3.5 (BLADE) ×2 IMPLANT
BLADE GREAT WHITE 4.2 (BLADE) ×2 IMPLANT
BNDG GAUZE ELAST 4 BULKY (GAUZE/BANDAGES/DRESSINGS) ×2 IMPLANT
DRAPE ARTHROSCOPY W/POUCH 114 (DRAPES) ×2 IMPLANT
DRAPE U-SHAPE 47X51 STRL (DRAPES) ×2 IMPLANT
DRSG EMULSION OIL 3X3 NADH (GAUZE/BANDAGES/DRESSINGS) ×2 IMPLANT
DURAPREP 26ML APPLICATOR (WOUND CARE) ×2 IMPLANT
ELECT MENISCUS 165MM 90D (ELECTRODE) IMPLANT
ELECT REM PT RETURN 9FT ADLT (ELECTROSURGICAL)
ELECTRODE REM PT RTRN 9FT ADLT (ELECTROSURGICAL) IMPLANT
GAUZE SPONGE 4X4 12PLY STRL (GAUZE/BANDAGES/DRESSINGS) ×2 IMPLANT
GLOVE BIO SURGEON STRL SZ8 (GLOVE) ×4 IMPLANT
GLOVE BIOGEL PI IND STRL 7.0 (GLOVE) ×2 IMPLANT
GLOVE BIOGEL PI IND STRL 8 (GLOVE) ×2 IMPLANT
GLOVE BIOGEL PI INDICATOR 7.0 (GLOVE) ×2
GLOVE BIOGEL PI INDICATOR 8 (GLOVE) ×2
GLOVE ECLIPSE 6.5 STRL STRAW (GLOVE) ×2 IMPLANT
GOWN STRL REUS W/ TWL LRG LVL3 (GOWN DISPOSABLE) ×1 IMPLANT
GOWN STRL REUS W/ TWL XL LVL3 (GOWN DISPOSABLE) ×2 IMPLANT
GOWN STRL REUS W/TWL LRG LVL3 (GOWN DISPOSABLE) ×1
GOWN STRL REUS W/TWL XL LVL3 (GOWN DISPOSABLE) ×2
IV NS IRRIG 3000ML ARTHROMATIC (IV SOLUTION) ×2 IMPLANT
KNEE WRAP E Z 3 GEL PACK (MISCELLANEOUS) ×2 IMPLANT
MANIFOLD NEPTUNE II (INSTRUMENTS) ×2 IMPLANT
PACK ARTHROSCOPY DSU (CUSTOM PROCEDURE TRAY) ×2 IMPLANT
PACK BASIN DAY SURGERY FS (CUSTOM PROCEDURE TRAY) ×2 IMPLANT
PENCIL BUTTON HOLSTER BLD 10FT (ELECTRODE) IMPLANT
SET ARTHROSCOPY TUBING (MISCELLANEOUS) ×1
SET ARTHROSCOPY TUBING LN (MISCELLANEOUS) ×1 IMPLANT
SHEET MEDIUM DRAPE 40X70 STRL (DRAPES) ×2 IMPLANT
SYR 3ML 18GX1 1/2 (SYRINGE) IMPLANT
TOWEL OR 17X24 6PK STRL BLUE (TOWEL DISPOSABLE) ×2 IMPLANT
TOWEL OR NON WOVEN STRL DISP B (DISPOSABLE) ×2 IMPLANT
WAND 30 DEG SABER W/CORD (SURGICAL WAND) IMPLANT
WAND STAR VAC 90 (SURGICAL WAND) IMPLANT
WATER STERILE IRR 1000ML POUR (IV SOLUTION) ×2 IMPLANT

## 2014-10-18 NOTE — Transfer of Care (Signed)
Immediate Anesthesia Transfer of Care Note  Patient: Taylor [name]  Procedure(s) Performed: Procedure(s): ARTHROSCOPY LEFT KNEE, PARTIAL MEDIAL MENISECTOMY, CHONDROPLASTY  (Left) CHONDROPLASTY (Left)  Patient Location: PACU  Anesthesia Type:General  Level of Consciousness: sedated  Airway & Oxygen Therapy: Patient Spontanous Breathing and Patient connected to face mask oxygen  Post-op Assessment: Report given to RN and Post -op Vital signs reviewed and stable  Post vital signs: Reviewed and stable  Last Vitals:  Filed Vitals:   10/18/14 1234  BP: 155/69  Pulse: 114  Temp: 36.6 C  Resp: 20    Complications: No apparent anesthesia complications

## 2014-10-18 NOTE — Discharge Instructions (Signed)

## 2014-10-18 NOTE — Anesthesia Procedure Notes (Signed)
Procedure Name: LMA Insertion Date/Time: 10/18/2014 1:48 PM Performed by: Caren Macadam Pre-anesthesia Checklist: Patient identified, Emergency Drugs available, Suction available and Patient being monitored Patient Re-evaluated:Patient Re-evaluated prior to inductionOxygen Delivery Method: Circle System Utilized Preoxygenation: Pre-oxygenation with 100% oxygen Intubation Type: IV induction Ventilation: Mask ventilation without difficulty LMA: LMA inserted LMA Size: 4.0 Number of attempts: 1 Airway Equipment and Method: Bite block Placement Confirmation: positive ETCO2 and breath sounds checked- equal and bilateral Tube secured with: Tape Dental Injury: Teeth and Oropharynx as per pre-operative assessment

## 2014-10-18 NOTE — Op Note (Signed)
#  [id number] 

## 2014-10-18 NOTE — Anesthesia Preprocedure Evaluation (Addendum)
Anesthesia Evaluation  Patient identified by MRN, date of birth, ID band Patient awake    Reviewed: Allergy & Precautions, H&P , NPO status , Patient's Chart, lab work & pertinent test results  History of Anesthesia Complications Negative for: history of anesthetic complications  Airway Mallampati: III  TM Distance: >3 FB Neck ROM: full    Dental no notable dental hx.    Pulmonary neg pulmonary ROS,    Pulmonary exam normal breath sounds clear to auscultation       Cardiovascular negative cardio ROS Normal cardiovascular exam Rhythm:regular Rate:Normal     Neuro/Psych  Headaches,    GI/Hepatic negative GI ROS, Neg liver ROS,   Endo/Other  negative endocrine ROS  Renal/GU negative Renal ROS     Musculoskeletal  (+) Arthritis ,   Abdominal (+) + obese,   Peds  Hematology negative hematology ROS (+)   Anesthesia Other Findings Left knee meniscal tear  Reproductive/Obstetrics negative OB ROS                           Anesthesia Physical Anesthesia Plan  ASA: II  Anesthesia Plan: General   Post-op Pain Management:    Induction: Intravenous  Airway Management Planned: LMA  Additional Equipment:   Intra-op Plan:   Post-operative Plan: Extubation in OR  Informed Consent: I have reviewed the patients History and Physical, chart, labs and discussed the procedure including the risks, benefits and alternatives for the proposed anesthesia with the patient or authorized representative who has indicated his/her understanding and acceptance.   Dental Advisory Given  Plan Discussed with: Anesthesiologist, CRNA and Surgeon  Anesthesia Plan Comments:         Anesthesia Quick Evaluation

## 2014-10-18 NOTE — Anesthesia Postprocedure Evaluation (Signed)
  Anesthesia Post-op Note  Patient: Taylor [name]  Procedure(s) Performed: Procedure(s) (LRB): ARTHROSCOPY LEFT KNEE, PARTIAL MEDIAL MENISECTOMY, CHONDROPLASTY  (Left) CHONDROPLASTY (Left)  Patient Location: PACU  Anesthesia Type: General  Level of Consciousness: awake and alert   Airway and Oxygen Therapy: Patient Spontanous Breathing  Post-op Pain: mild  Post-op Assessment: Post-op Vital signs reviewed, Patient's Cardiovascular Status Stable, Respiratory Function Stable, Patent Airway and No signs of Nausea or vomiting  Last Vitals:  Filed Vitals:   10/18/14 1500  BP: 128/75  Pulse: 103  Temp:   Resp: 18    Post-op Vital Signs: stable   Complications: No apparent anesthesia complications Patient initially with BP of 80/40 in pacu secondary to being intravascularly volume deplete in setting of decreased sympathetic central output (from fentanyl) in patient with chronic hypertension. 1L Crystalloid given for total of 2L, phenylephrine x2 given to temporize while giving crystalloid and BP responded within 5 minutes to systolic >100. Patient was awake and asymptomatic throughout.

## 2014-10-18 NOTE — Interval H&P Note (Signed)
OK for surgery PD 

## 2014-10-19 ENCOUNTER — Encounter (HOSPITAL_BASED_OUTPATIENT_CLINIC_OR_DEPARTMENT_OTHER): Payer: Self-pay | Admitting: Orthopaedic Surgery

## 2014-10-19 NOTE — Op Note (Signed)
[name], Taylor [name] NO.:  [id number]  MEDICAL RECORD NO.:  [id number]  LOCATION:                               FACILITY:  [facility]  PHYSICIAN:  Lubertha Basque. Dalldorf, M.D.DATE OF BIRTH:  July 26, 1960  DATE OF PROCEDURE:  10/18/2014 DATE OF DISCHARGE:  10/18/2014                              OPERATIVE REPORT   PREOPERATIVE DIAGNOSES: 1. Left knee torn medial meniscus. 2. Left knee chondromalacia patella.  POSTOPERATIVE DIAGNOSES: 1. Left knee torn medial meniscus. 2. Left knee chondromalacia patella.  PROCEDURE: 1. Left knee partial meniscectomy. 2. Left knee abrasion chondroplasty patellofemoral.  ANESTHESIA:  General.  ATTENDING SURGEON:  Lubertha Basque. Jerl Santos, M.D.  ASSISTANT:  Elodia Florence, PA.  INDICATION FOR PROCEDURE:  The patient is a 54 year old woman with a long history of left knee pain.  She suffered a recent slip and has had an inability to move her knee fully.  It was basically locked.  An MRI scan shows a displaced bucket-handle tear of the medial meniscus.  She continues with some significant difficulty and is on 2 crutches with the brace.  She is offered an arthroscopy.  Informed operative consent was obtained after discussion of possible complications including reaction to anesthesia and infection.  SUMMARY OF FINDINGS AND PROCEDURE:  Under general anesthesia, a left knee arthroscopy was performed.  Suprapatellar pouch was benign while the patellofemoral joint exhibited focal breakdown at the apex of the patella.  A thorough chondroplasty was done with noted abrasion to bleeding bone in 1 tiny area at the apex of the patella.  Medial compartment did have a displaceable tear of the posterior horn.  This was incarcerated.  This was brought into the knee and removed.  We performed about 20% partial medial meniscectomy in the process.  She had no degenerative change in this compartment other than focally in the area of her meniscal injury.  ACL  looked normal and the lateral compartment was benign.  She was discharged home on the same day.  DESCRIPTION OF PROCEDURE:  The patient was taken to the operating suite where general anesthetic was applied without difficulty.  She was positioned supine and prepped and draped in normal sterile fashion. After the administration of preop IV Kefzol and an appropriate time out, an arthroscopy of the left knee was performed through total of 2 portals.  Findings were as noted above and procedure consisted of the partial medial meniscectomy which was done with basket and shaver.  I also performed the chondroplasty described above.  The knee was thoroughly irrigated, followed by placement of Marcaine with epinephrine and morphine into the joint.  Adaptic was placed over the portals followed by dry gauze and loose Ace wrap.  Estimated blood loss and fluids can be obtained from anesthesia records.  DISPOSITION:  The patient was extubated in the operating room and taken to recovery room in stable condition.  She was to go home on same-day and follow up in the office closely.  I will contact her by phone tonight.     Lubertha Basque Jerl Santos, M.D.     PGD/MEDQ  D:  10/18/2014  T:  10/18/2014  Job:  [id number]

## 2014-10-25 ENCOUNTER — Ambulatory Visit: Payer: 59 | Admitting: Family Medicine

## 2014-10-31 ENCOUNTER — Encounter: Payer: Self-pay | Admitting: Physical Therapy

## 2014-10-31 ENCOUNTER — Ambulatory Visit: Payer: 59 | Attending: Orthopaedic Surgery | Admitting: Physical Therapy

## 2014-10-31 DIAGNOSIS — M25662 Stiffness of left knee, not elsewhere classified: Secondary | ICD-10-CM | POA: Diagnosis present

## 2014-10-31 DIAGNOSIS — R262 Difficulty in walking, not elsewhere classified: Secondary | ICD-10-CM | POA: Insufficient documentation

## 2014-10-31 DIAGNOSIS — M25562 Pain in left knee: Secondary | ICD-10-CM | POA: Insufficient documentation

## 2014-10-31 NOTE — Therapy (Signed)
[facility] [place] [address] [place], [place], [address] Phone: [phone]   Fax:  [phone]  Physical Therapy Evaluation  Patient Details  Name: Taylor [name] MRN: [id number] Date of Birth: 12/15/1960 Referring Provider:  Marcene Corning, MD  Encounter Date: 10/31/2014      PT End of Session - 10/31/14 0812    Visit Number 1   PT Start Time 0750   PT Stop Time 0824   PT Time Calculation (min) 34 min   Activity Tolerance Patient tolerated treatment well   Behavior During Therapy [facility] for tasks assessed/performed      Past Medical History  Diagnosis Date  . Headaches, cluster   . Migraines   . Tear of MCL (medial collateral ligament) of knee     Past Surgical History  Procedure Laterality Date  . Gallbladder surgery    . Tonsillectomy    . Adenoidectomy    . Mouth surgery    . Rotator cuff surgery Right   . Cholecystectomy    . Knee arthroscopy with medial menisectomy Left 10/18/2014    Procedure: ARTHROSCOPY LEFT KNEE, PARTIAL MEDIAL MENISECTOMY, CHONDROPLASTY ;  Surgeon: Marcene Corning, MD;  Location: [facility];  Service: Orthopedics;  Laterality: Left;  . Chondroplasty Left 10/18/2014    Procedure: CHONDROPLASTY;  Surgeon: Marcene Corning, MD;  Location:  SURGERY CENTER;  Service: Orthopedics;  Laterality: Left;    There were no vitals filed for this visit.  Visit Diagnosis:  Left knee pain - Plan: PT plan of care cert/re-cert  Knee stiffness, left - Plan: PT plan of care cert/re-cert  Difficulty walking - Plan: PT plan of care cert/re-cert      Subjective Assessment - 10/31/14 0752    Subjective Patient reports that she injured the knee about 5-6 weeks ago, reports no real fall or twist just started hurting at work.  She underwent the left knee arthroscopy on 10/18/14   Limitations Walking;House hold activities   Diagnostic tests torn meniscus   Patient Stated Goals  return to walk without pain   Currently in Pain? Yes   Pain Score 2    Pain Location Knee   Pain Orientation Left   Pain Descriptors / Indicators Aching   Pain Type Surgical pain   Pain Onset 1 to 4 weeks ago   Pain Frequency Intermittent   Aggravating Factors  squatting, stairs, getting up into bed pain up to 6/10   Pain Relieving Factors rest pain to 1/10   Effect of Pain on Daily Activities not working yet            [facility] PT Assessment - 10/31/14 0001    Assessment   Medical Diagnosis s/p left knee scope with meniscal debridement   Onset Date/Surgical Date 10/18/14   Next MD Visit 11/14/14   Prior Therapy n   Precautions   Precautions None   Balance Screen   Has the patient fallen in the past 6 months Yes   How many times? 1   Has the patient had a decrease in activity level because of a fear of falling?  No   Is the patient reluctant to leave their home because of a fear of falling?  No   Home Environment   Additional Comments stairs, does housework   Prior Function   Level of Independence Independent   Vocation Full time employment   Vocation Requirements oncology nurse, some lifting   Leisure no exercises  AROM   Overall AROM Comments AROM at edge of bed was 0-104 degrees flexoin   Strength   Overall Strength Comments 4-/5 with pain "deep in the knee"   Palpation   Palpation comment scars mobile, minimal to no increase in temperature.   Ambulation/Gait   Gait Comments no device, mild antalgic on the left                   [facility] Adult PT Treatment/Exercise - 10/31/14 0001    Exercises   Exercises Knee/Hip   Knee/Hip Exercises: Aerobic   Elliptical 3 minutes I=8, R=6   Recumbent Bike 5 minutes                PT Education - 10/31/14 0811    Education provided Yes   Education Details Flexibility for HS, calf, ITB, piriformis, QS and SAQ's   Person(s) Educated Patient   Methods Explanation;Demonstration;Handout   Comprehension  Verbalized understanding          PT Short Term Goals - 10/31/14 0830    PT SHORT TERM GOAL #1   Title independent with initial HEP   Time 1   Period Weeks   Status New           PT Long Term Goals - 10/31/14 0831    PT LONG TERM GOAL #1   Title decrease pain 50%   Time 8   Period Weeks   Status New   PT LONG TERM GOAL #2   Title Increase ROM of the left knee to WFL's   Time 8   Period Weeks   Status New   PT LONG TERM GOAL #3   Title walk up and down stairs reciprocally   Time 8   Period Weeks   Status New   PT LONG TERM GOAL #4   Title walk all distances without issue   Time 8   Period Weeks   Status New               Plan - 10/31/14 8413    Clinical Impression Statement Patient with left knee scope for meniscal debridement on 10/18/14.  She is a Engineer, civil (consulting).  ROM is limited into flexion, good ability to contract VMO.  Planning on returning to work on 11/14/14.   Pt will benefit from skilled therapeutic intervention in order to improve on the following deficits Abnormal gait;Decreased strength;Decreased range of motion;Difficulty walking;Increased edema;Pain   Rehab Potential Good   PT Frequency 2x / week   PT Duration 6 weeks   PT Treatment/Interventions Electrical Stimulation;Iontophoresis /ml Dexamethasone;Cryotherapy;Ultrasound;Stair training;Therapeutic exercise;Manual techniques;Vasopneumatic Device;Patient/family education   PT Next Visit Plan next week add exercises, gait around building and stairs   Consulted and Agree with Plan of Care Patient         Problem List Patient Active Problem List   Diagnosis Date Noted  . Tear of MCL (medial collateral ligament) of knee 09/19/2014  . Metatarsalgia of both feet 08/29/2014  . Chronic back pain 07/12/2014  . Pes anserine bursitis 07/12/2014  . Polyarthralgia 06/21/2014  . 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 Plantar fasciitis 08/26/2010  . Migraines 08/26/2010     Jearld Lesch., PT 10/31/2014, 8:36 AM  [facility] [place] [address] [place], [place], [address] Phone: [phone]   Fax:  [phone]

## 2014-11-06 ENCOUNTER — Encounter: Payer: Self-pay | Admitting: Physical Therapy

## 2014-11-06 ENCOUNTER — Ambulatory Visit: Payer: 59 | Admitting: Physical Therapy

## 2014-11-06 DIAGNOSIS — R262 Difficulty in walking, not elsewhere classified: Secondary | ICD-10-CM

## 2014-11-06 DIAGNOSIS — M25662 Stiffness of left knee, not elsewhere classified: Secondary | ICD-10-CM

## 2014-11-06 DIAGNOSIS — M25562 Pain in left knee: Secondary | ICD-10-CM | POA: Diagnosis not present

## 2014-11-06 NOTE — Therapy (Signed)
[facility] [place] [address] [place], [place], [address] Phone: [phone]   Fax:  [phone]  Physical Therapy Treatment  Patient Details  Name: Taylor [name] MRN: [id number] Date of Birth: [birth year] Referring Provider:  Marcene Corning, MD  Encounter Date: 11/06/2014      PT End of Session - 11/06/14 1010    Visit Number 2   PT Start Time 0931   PT Stop Time 1012   PT Time Calculation (min) 41 min   Activity Tolerance Patient tolerated treatment well   Behavior During Therapy [facility] for tasks assessed/performed      Past Medical History  Diagnosis Date  . Headaches, cluster   . Migraines   . Tear of MCL (medial collateral ligament) of knee     Past Surgical History  Procedure Laterality Date  . Gallbladder surgery    . Tonsillectomy    . Adenoidectomy    . Mouth surgery    . Rotator cuff surgery Right   . Cholecystectomy    . Knee arthroscopy with medial menisectomy Left 10/18/2014    Procedure: ARTHROSCOPY LEFT KNEE, PARTIAL MEDIAL MENISECTOMY, CHONDROPLASTY ;  Surgeon: Marcene Corning, MD;  Location: [facility];  Service: Orthopedics;  Laterality: Left;  . Chondroplasty Left 10/18/2014    Procedure: CHONDROPLASTY;  Surgeon: Marcene Corning, MD;  Location:  SURGERY CENTER;  Service: Orthopedics;  Laterality: Left;    There were no vitals filed for this visit.  Visit Diagnosis:  Left knee pain  Knee stiffness, left  Difficulty walking      Subjective Assessment - 11/06/14 0932    Subjective Pt reports no new changes, minor stiffness   Limitations Walking;House hold activities   Patient Stated Goals return to walk without pain   Currently in Pain? No/denies   Pain Score 0-No pain                         [facility] Adult PT Treatment/Exercise - 11/06/14 0001    Ambulation/Gait   Gait Comments no device, mild antalgic on the left   Knee/Hip Exercises:  Aerobic   Elliptical 5 minutes I=5, R=5   Recumbent Bike 6 minutes   Knee/Hip Exercises: Machines for Strengthening   Cybex Knee Flexion 45lb 3x10   Cybex Leg Press 60lb 3x10    Other Machine Calf raises #60 x30   Knee/Hip Exercises: Standing   Hip Abduction Stengthening;2 sets;20 reps;Knee bent   Abduction Limitations Blue Tband    Forward Step Up Left;2 sets;10 reps;Step Height: 4"   Other Standing Knee Exercises Cable press downs #70 3x15   Knee/Hip Exercises: Seated   Long Arc Quad Left;AROM;1 set;20 reps   Sit to [employer] 2 sets;10 reps;without UE support  holding yellow weighted ball.                   PT Short Term Goals - 10/31/14 0830    PT SHORT TERM GOAL #1   Title independent with initial HEP   Time 1   Period Weeks   Status New           PT Long Term Goals - 10/31/14 0831    PT LONG TERM GOAL #1   Title decrease pain 50%   Time 8   Period Weeks   Status New   PT LONG TERM GOAL #2   Title Increase ROM of the left knee to [facility]  Time 8   Period Weeks   Status New   PT LONG TERM GOAL #3   Title walk up and down stairs reciprocally   Time 8   Period Weeks   Status New   PT LONG TERM GOAL #4   Title walk all distances without issue   Time 8   Period Weeks   Status New               Plan - 11/06/14 1010    Clinical Impression Statement Pt tolerated treatment well  evident by no subjective increase in pain. Able to tolerate all gym level exercises well. Pt reports that she has nob been doing her HEP, Pt encourage to perform exercises at home   Pt will benefit from skilled therapeutic intervention in order to improve on the following deficits Abnormal gait;Decreased strength;Decreased range of motion;Difficulty walking;Increased edema;Pain   Rehab Potential Good   PT Frequency 2x / week   PT Duration 6 weeks   PT Treatment/Interventions Electrical Stimulation;Iontophoresis /ml Dexamethasone;Cryotherapy;Ultrasound;Stair  training;Therapeutic exercise;Manual techniques;Vasopneumatic Device;Patient/family education   PT Next Visit Plan continue to add exercises, gait around building and stairs        Problem List Patient Active Problem List   Diagnosis Date Noted  . Tear of MCL (medial collateral ligament) of knee 09/19/2014  . Metatarsalgia of both feet 08/29/2014  . Chronic back pain 07/12/2014  . Pes anserine bursitis 07/12/2014  . Polyarthralgia 06/21/2014  . 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 Plantar fasciitis 08/26/2010  . Migraines 08/26/2010    Grayce Sessions, PTA  11/06/2014, 10:14 AM  [facility] [address] [place] [address] [place], [place], [address] Phone: [phone]   Fax:  [phone]

## 2014-11-09 ENCOUNTER — Encounter: Payer: Self-pay | Admitting: Physical Therapy

## 2014-11-09 ENCOUNTER — Ambulatory Visit: Payer: 59 | Admitting: Physical Therapy

## 2014-11-09 DIAGNOSIS — M25562 Pain in left knee: Secondary | ICD-10-CM | POA: Diagnosis not present

## 2014-11-09 DIAGNOSIS — M25662 Stiffness of left knee, not elsewhere classified: Secondary | ICD-10-CM

## 2014-11-09 NOTE — Therapy (Signed)
[facility] [place] [address] [place], [place], [address] Phone: [phone]   Fax:  [phone]  Physical Therapy Treatment  Patient Details  Name: Taylor [name] MRN: [id number] Date of Birth: 05/02/1960 No Data Recorded  Encounter Date: 11/09/2014      PT End of Session - 11/09/14 1007    Visit Number 3   PT Start Time 0930   PT Stop Time 1030   PT Time Calculation (min) 60 min      Past Medical History  Diagnosis Date  . Headaches, cluster   . Migraines   . Tear of MCL (medial collateral ligament) of knee     Past Surgical History  Procedure Laterality Date  . Gallbladder surgery    . Tonsillectomy    . Adenoidectomy    . Mouth surgery    . Rotator cuff surgery Right   . Cholecystectomy    . Knee arthroscopy with medial menisectomy Left 10/18/2014    Procedure: ARTHROSCOPY LEFT KNEE, PARTIAL MEDIAL MENISECTOMY, CHONDROPLASTY ;  Surgeon: Marcene CorningPeter Dalldorf, MD;  Location: [facility];  Service: Orthopedics;  Laterality: Left;  . Chondroplasty Left 10/18/2014    Procedure: CHONDROPLASTY;  Surgeon: Marcene CorningPeter Dalldorf, MD;  Location: [facility];  Service: Orthopedics;  Laterality: Left;    There were no vitals filed for this visit.  Visit Diagnosis:  Left knee pain  Knee stiffness, left      Subjective Assessment - 11/09/14 0936    Subjective sore in joint today and behind knee   Pain Score 3    Pain Location Knee   Pain Orientation Left                         [facility] Adult PT Treatment/Exercise - 11/09/14 0001    Knee/Hip Exercises: Aerobic   Nustep L 5 6 min   Knee/Hip Exercises: Standing   Lateral Step Up Left;2 sets;10 reps;Hand Hold: 2;Step Height: 4"   Forward Step Up Left;2 sets;10 reps;Step Height: 4"   Other Standing Knee Exercises green tband 15 times each hip flex,ext,abd and SLR with ER   Other Standing Knee Exercises 30# pulley leg press down  2 sets 15   Knee/Hip Exercises: Seated   Long Arc Quad Left;1 set;15 reps  e sec green tband   Hamstring Curl Strengthening;Left;1 set;15 reps  green tband   Modalities   Modalities Electrical Stimulation   Electrical Stimulation   Electrical Stimulation Location Left knee   Electrical Stimulation Parameters IFC   Electrical Stimulation Goals Edema;Pain   Manual Therapy   Manual Therapy Joint mobilization;Soft tissue mobilization;Passive ROM   Manual therapy comments left knee                  PT Short Term Goals - 10/31/14 0830    PT SHORT TERM GOAL #1   Title independent with initial HEP   Time 1   Period Weeks   Status New           PT Long Term Goals - 10/31/14 0831    PT LONG TERM GOAL #1   Title decrease pain 50%   Time 8   Period Weeks   Status New   PT LONG TERM GOAL #2   Title Increase ROM of the left knee to WFL's   Time 8   Period Weeks   Status New   PT LONG TERM GOAL #3  Title walk up and down stairs reciprocally   Time 8   Period Weeks   Status New   PT LONG TERM GOAL #4   Title walk all distances without issue   Time 8   Period Weeks   Status New               Plan - 11/09/14 1008    Clinical Impression Statement post knee pain but tolerated ther ex well, focused on lighter weights and control of mvmt today   PT Next Visit Plan continue to add exercises, gait around building and stairs        Problem List Patient Active Problem List   Diagnosis Date Noted  . Tear of MCL (medial collateral ligament) of knee 09/19/2014  . Metatarsalgia of both feet 08/29/2014  . Chronic back pain 07/12/2014  . Pes anserine bursitis 07/12/2014  . Polyarthralgia 06/21/2014  . 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 Plantar fasciitis 08/26/2010  . Migraines 08/26/2010    Clarisse Rodriges,ANGIE PTA 11/09/2014, 10:09 AM  [facility] [place] [address] [place],  [place], [address] Phone: [phone]   Fax:  [phone]  Name: Taylor [name] MRN: [id number] Date of Birth: [birth year]

## 2014-11-12 ENCOUNTER — Encounter: Payer: Self-pay | Admitting: Physical Therapy

## 2014-11-12 ENCOUNTER — Ambulatory Visit: Payer: 59 | Admitting: Physical Therapy

## 2014-11-12 DIAGNOSIS — R262 Difficulty in walking, not elsewhere classified: Secondary | ICD-10-CM

## 2014-11-12 DIAGNOSIS — M25662 Stiffness of left knee, not elsewhere classified: Secondary | ICD-10-CM

## 2014-11-12 DIAGNOSIS — M25562 Pain in left knee: Secondary | ICD-10-CM | POA: Diagnosis not present

## 2014-11-12 NOTE — Therapy (Signed)
[facility] [place] [address] [place], [place], [address] Phone: [phone]   Fax:  [phone]  Physical Therapy Treatment  Patient Details  Name: Taylor [name] MRN: [id number] Date of Birth: 04/11/1960 No Data Recorded  Encounter Date: 11/12/2014      PT End of Session - 11/12/14 0839    Visit Number 4   PT Start Time 0757   PT Stop Time 0847   PT Time Calculation (min) 50 min   Activity Tolerance Patient tolerated treatment well   Behavior During Therapy [facility] for tasks assessed/performed      Past Medical History  Diagnosis Date  . Headaches, cluster   . Migraines   . Tear of MCL (medial collateral ligament) of knee     Past Surgical History  Procedure Laterality Date  . Gallbladder surgery    . Tonsillectomy    . Adenoidectomy    . Mouth surgery    . Rotator cuff surgery Right   . Cholecystectomy    . Knee arthroscopy with medial menisectomy Left 10/18/2014    Procedure: ARTHROSCOPY LEFT KNEE, PARTIAL MEDIAL MENISECTOMY, CHONDROPLASTY ;  Surgeon: Marcene CorningPeter Dalldorf, MD;  Location: [facility];  Service: Orthopedics;  Laterality: Left;  . Chondroplasty Left 10/18/2014    Procedure: CHONDROPLASTY;  Surgeon: Marcene CorningPeter Dalldorf, MD;  Location: [facility];  Service: Orthopedics;  Laterality: Left;    There were no vitals filed for this visit.  Visit Diagnosis:  Left knee pain  Knee stiffness, left  Difficulty walking      Subjective Assessment - 11/12/14 0759    Subjective pain in the posterior right knee mostly with walking, when I straighten my knee.   Currently in Pain? Yes   Pain Score 3    Pain Location Knee   Pain Orientation Right;Posterior   Pain Descriptors / Indicators Tightness   Pain Type Surgical pain   Aggravating Factors  walking and when I straighten my leg   Pain Relieving Factors rest                         [facility] Adult PT  Treatment/Exercise - 11/12/14 0001    Knee/Hip Exercises: Stretches   Passive Hamstring Stretch 30 seconds;3 reps   Gastroc Stretch 30 seconds;3 reps   Knee/Hip Exercises: Aerobic   Elliptical 5 minutes I=5, R=5   Nustep L 5 6 min   Modalities   Modalities Ultrasound   Electrical Stimulation   Electrical Stimulation Location Left knee   Electrical Stimulation Action IFC   Electrical Stimulation Parameters tolerance   Electrical Stimulation Goals Pain   Ultrasound   Ultrasound Location left posterior medial knee   Ultrasound Parameters 3.3MHz 1.4w/cm2   Ultrasound Goals Pain                PT Education - 11/12/14 0839    Education provided Yes   Education Details hamstring and calf stretches   Person(s) Educated Patient   Methods Explanation;Demonstration   Comprehension Verbalized understanding          PT Short Term Goals - 10/31/14 0830    PT SHORT TERM GOAL #1   Title independent with initial HEP   Time 1   Period Weeks   Status New           PT Long Term Goals - 10/31/14 0831    PT LONG TERM GOAL #1   Title decrease  pain 50%   Time 8   Period Weeks   Status New   PT LONG TERM GOAL #2   Title Increase ROM of the left knee to WFL's   Time 8   Period Weeks   Status New   PT LONG TERM GOAL #3   Title walk up and down stairs reciprocally   Time 8   Period Weeks   Status New   PT LONG TERM GOAL #4   Title walk all distances without issue   Time 8   Period Weeks   Status New               Plan - 11/12/14 0840    Clinical Impression Statement Patient seems to have very tight HS and calf mms that cause difficulty walking after any sitting   PT Next Visit Plan continue to add exercises, gait around building and stairs   Consulted and Agree with Plan of Care Patient        Problem List Patient Active Problem List   Diagnosis Date Noted  . Tear of MCL (medial collateral ligament) of knee 09/19/2014  . Metatarsalgia of both feet  08/29/2014  . Chronic back pain 07/12/2014  . Pes anserine bursitis 07/12/2014  . Polyarthralgia 06/21/2014  . 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 Plantar fasciitis 08/26/2010  . Migraines 08/26/2010    Jearld Lesch., PT 11/12/2014, 8:41 AM  [facility] [address] [place], [place], [address] Phone: [phone]   Fax:  [phone]  Name: Taylor [name] MRN: [id number] Date of Birth: 12-10-1960

## 2014-11-14 ENCOUNTER — Ambulatory Visit: Payer: 59 | Admitting: Physical Therapy

## 2014-11-14 ENCOUNTER — Encounter: Payer: Self-pay | Admitting: Physical Therapy

## 2014-11-14 DIAGNOSIS — M25662 Stiffness of left knee, not elsewhere classified: Secondary | ICD-10-CM

## 2014-11-14 DIAGNOSIS — M25562 Pain in left knee: Secondary | ICD-10-CM | POA: Diagnosis not present

## 2014-11-14 DIAGNOSIS — R262 Difficulty in walking, not elsewhere classified: Secondary | ICD-10-CM

## 2014-11-14 NOTE — Therapy (Signed)
[place] [address] [place], [place], [address] Phone: [phone]   Fax:  [phone]  Physical Therapy Treatment  Patient Details  Name: Taylor [name] MRN: [id number] Date of Birth: 1960/12/21 No Data Recorded  Encounter Date: 11/14/2014      PT End of Session - 11/14/14 0846    Visit Number 5   PT Start Time 0800   PT Stop Time 0855   PT Time Calculation (min) 55 min   Activity Tolerance Patient tolerated treatment well;Patient limited by fatigue   Behavior During Therapy [facility] for tasks assessed/performed      Past Medical History  Diagnosis Date  . Headaches, cluster   . Migraines   . Tear of MCL (medial collateral ligament) of knee     Past Surgical History  Procedure Laterality Date  . Gallbladder surgery    . Tonsillectomy    . Adenoidectomy    . Mouth surgery    . Rotator cuff surgery Right   . Cholecystectomy    . Knee arthroscopy with medial menisectomy Left 10/18/2014    Procedure: ARTHROSCOPY LEFT KNEE, PARTIAL MEDIAL MENISECTOMY, CHONDROPLASTY ;  Surgeon: Melrose Nakayama, MD;  Location: [place];  Service: Orthopedics;  Laterality: Left;  . Chondroplasty Left 10/18/2014    Procedure: CHONDROPLASTY;  Surgeon: Melrose Nakayama, MD;  Location: [place];  Service: Orthopedics;  Laterality: Left;    There were no vitals filed for this visit.  Visit Diagnosis:  Knee stiffness, left  Left knee pain  Difficulty walking      Subjective Assessment - 11/14/14 0759    Subjective Pt reports this she is doing better, L knee was pulling in the back so she has been stretching and it feels better   Currently in Pain? No/denies   Pain Score 0-No pain                         [facility] Adult PT Treatment/Exercise - 11/14/14 0001    Ambulation/Gait   Gait Comments Negotiate two flights of stairs, one rail, alternating pattern. Ambulated around  building on uneven terrain up and down slopes > 1000 ft     Knee/Hip Exercises: Stretches   Passive Hamstring Stretch 10 seconds;5 reps   Hip Flexor Stretch 3 reps;10 seconds   Gastroc Stretch 3 reps;10 seconds   Knee/Hip Exercises: Aerobic   Elliptical 5 minutes I=5, R=5   Recumbent Bike 4 minutes   Knee/Hip Exercises: Machines for Strengthening   Cybex Knee Flexion 45lb 3x10   Cybex Leg Press 60lb 2x15    Other Machine Calf raises black bar  2x20   Knee/Hip Exercises: Standing   Other Standing Knee Exercises BOSU lateral step ups and overs x5, hand hold    Other Standing Knee Exercises 40# pulley leg press down 2 sets 15   Modalities   Modalities Cryotherapy   Cryotherapy   Number Minutes Cryotherapy 10 Minutes   Cryotherapy Location Hip   Type of Cryotherapy Ice pack                  PT Short Term Goals - 11/14/14 0258    PT SHORT TERM GOAL #1   Title independent with initial HEP   Status Achieved           PT Long Term Goals - 11/14/14 5277    PT LONG TERM GOAL #3   Title walk up  and down stairs reciprocally   Status Achieved   PT LONG TERM GOAL #4   Title walk all distances without issue   Status Partially Met               Plan - 11/14/14 0848    Clinical Impression Statement Pt HS and calf not as tight this day. Pt able to tolerated more functional interventions this date but demos a decrease activity tolerance. Pt required multiple rest breaks but able to complete all interventions. Pt reports that will be seeing the MD today, and he could possibly send her back to work. Pt reports that she feels like she could perform her work duties but will become fatigue during the day.    Pt will benefit from skilled therapeutic intervention in order to improve on the following deficits Abnormal gait;Decreased strength;Decreased range of motion;Difficulty walking;Increased edema;Pain   Rehab Potential Good   PT Frequency 2x / week   PT Duration 6 weeks    PT Treatment/Interventions Electrical Stimulation;Iontophoresis 50m/ml Dexamethasone;Cryotherapy;Ultrasound;Stair training;Therapeutic exercise;Manual techniques;Vasopneumatic Device;Patient/family education   PT Next Visit Plan Continue to progress with functional training        Problem List Patient Active Problem List   Diagnosis Date Noted  . Tear of MCL (medial collateral ligament) of knee 09/19/2014  . Metatarsalgia of both feet 08/29/2014  . Chronic back pain 07/12/2014  . Pes anserine bursitis 07/12/2014  . Polyarthralgia 06/21/2014  . 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 Plantar fasciitis 08/26/2010  . Migraines 08/26/2010    RScot Jun PTA  11/14/2014, 8:53 AM  [address] [address]Phone: [phone]  Fax:  [phone] Name: Taylor PostelMRN: [id number] of Birth: 104/22/1962

## 2014-11-19 ENCOUNTER — Ambulatory Visit: Payer: 59 | Admitting: Physical Therapy

## 2014-11-20 ENCOUNTER — Encounter: Payer: Self-pay | Admitting: Physical Therapy

## 2014-11-20 ENCOUNTER — Ambulatory Visit: Payer: 59 | Admitting: Physical Therapy

## 2014-11-20 DIAGNOSIS — M25562 Pain in left knee: Secondary | ICD-10-CM | POA: Diagnosis not present

## 2014-11-20 DIAGNOSIS — M25662 Stiffness of left knee, not elsewhere classified: Secondary | ICD-10-CM

## 2014-11-20 NOTE — Therapy (Signed)
[address] [place], [place], [address] Phone: [phone]   Fax:  [phone]  Physical Therapy Treatment  Patient Details  Name: Taylor [name] MRN: [id number] Date of Birth: [birth year] No Data Recorded  Encounter Date: 11/20/2014      PT End of Session - 11/20/14 0922    Visit Number 6   PT Start Time 0840   PT Stop Time 0925   PT Time Calculation (min) 45 min      Past Medical History  Diagnosis Date  . Headaches, cluster   . Migraines   . Tear of MCL (medial collateral ligament) of knee     Past Surgical History  Procedure Laterality Date  . Gallbladder surgery    . Tonsillectomy    . Adenoidectomy    . Mouth surgery    . Rotator cuff surgery Right   . Cholecystectomy    . Knee arthroscopy with medial menisectomy Left 10/18/2014    Procedure: ARTHROSCOPY LEFT KNEE, PARTIAL MEDIAL MENISECTOMY, CHONDROPLASTY ;  Surgeon: Melrose Nakayama, MD;  Location: [place];  Service: Orthopedics;  Laterality: Left;  . Chondroplasty Left 10/18/2014    Procedure: CHONDROPLASTY;  Surgeon: Melrose Nakayama, MD;  Location: [place];  Service: Orthopedics;  Laterality: Left;    There were no vitals filed for this visit.  Visit Diagnosis:  Left knee pain  Knee stiffness, left      Subjective Assessment - 11/20/14 0839    Subjective Pt reports increased soreness post exercise last session. No longer reporting pain behind the knee, but c/o of pain on top of knee inside the joint. Thinks stretching last session irritated knee pain.    Currently in Pain? Yes   Pain Score 2    Pain Location Knee   Pain Orientation Left                         [facility] Adult PT Treatment/Exercise - 11/20/14 0001    Bed Mobility   Bed Mobility Scooting to [facility]   Knee/Hip Exercises: Aerobic   Nustep L 6 56mn   Knee/Hip Exercises: Machines for Strengthening   Cybex Knee  Extension 15# 3 set 10  eccentric lowering with 1 leg   Cybex Knee Flexion 45lb 3 x 15   Cybex Leg Press 60lb 3x 10   Other Machine calf raises leg press 3x 10 40#   Knee/Hip Exercises: Standing   Forward Step Up Left;1 set;10 reps;Hand Hold: 2  up over lower and reverse   Other Standing Knee Exercises BOSU lateral step ups and overs x10 hand hold   Dead lifts 7# 2 sets 10   Other Standing Knee Exercises green theraband 3 ways 3x 10                   PT Short Term Goals - 11/14/14 01601   PT SHORT TERM GOAL #1   Title independent with initial HEP   Status Achieved           PT Long Term Goals - 11/14/14 00932   PT LONG TERM GOAL #3   Title walk up and down stairs reciprocally   Status Achieved   PT LONG TERM GOAL #4   Title walk all distances without issue   Status Partially Met               Plan - 11/20/14 03557  Clinical Impression Statement Pt tolerated exercises well. Pt required some verbal queing for techniques. Visible muscle fatigue with step up activies, and fatigued requiring rest break with hip theraband activities. When questioned about pain pt complained of no joint just muscle pain during session.    PT Next Visit Plan Contnue with abduction and step up activities.         Problem List Patient Active Problem List   Diagnosis Date Noted  . Tear of MCL (medial collateral ligament) of knee 09/19/2014  . Metatarsalgia of both feet 08/29/2014  . Chronic back pain 07/12/2014  . Pes anserine bursitis 07/12/2014  . Polyarthralgia 06/21/2014  . 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 Plantar fasciitis 08/26/2010  . Migraines 08/26/2010    Crist Fat, SPTA 11/20/2014, 9:28 AM  [address] [place], [place], [address] Phone: [phone]   Fax:  [phone]  Name: Taylor [name] MRN: [id number] Date of Birth: 1960/03/17

## 2014-11-21 ENCOUNTER — Ambulatory Visit: Payer: 59 | Admitting: Physical Therapy

## 2014-11-23 ENCOUNTER — Ambulatory Visit: Payer: 59 | Admitting: Physical Therapy

## 2014-11-23 DIAGNOSIS — M25662 Stiffness of left knee, not elsewhere classified: Secondary | ICD-10-CM

## 2014-11-23 DIAGNOSIS — M25562 Pain in left knee: Secondary | ICD-10-CM

## 2014-11-23 NOTE — Therapy (Signed)
[place] [address] [place], [place], [address] Phone: [phone]   Fax:  [phone]  Physical Therapy Treatment  Patient Details  Name: Taylor [name] MRN: [id number] Date of Birth: 07-14-1960 No Data Recorded  Encounter Date: 11/23/2014      PT End of Session - 11/23/14 0904    Visit Number 7   PT Start Time 0845   PT Stop Time 0930   PT Time Calculation (min) 45 min      Past Medical History  Diagnosis Date  . Headaches, cluster   . Migraines   . Tear of MCL (medial collateral ligament) of knee     Past Surgical History  Procedure Laterality Date  . Gallbladder surgery    . Tonsillectomy    . Adenoidectomy    . Mouth surgery    . Rotator cuff surgery Right   . Cholecystectomy    . Knee arthroscopy with medial menisectomy Left 10/18/2014    Procedure: ARTHROSCOPY LEFT KNEE, PARTIAL MEDIAL MENISECTOMY, CHONDROPLASTY ;  Surgeon: Melrose Nakayama, MD;  Location: [place];  Service: Orthopedics;  Laterality: Left;  . Chondroplasty Left 10/18/2014    Procedure: CHONDROPLASTY;  Surgeon: Melrose Nakayama, MD;  Location: [place];  Service: Orthopedics;  Laterality: Left;    There were no vitals filed for this visit.  Visit Diagnosis:  Knee stiffness, left  Left knee pain      Subjective Assessment - 11/23/14 0849    Subjective 50% better over all, tired/fatigued at work,stiffness esp after prolonged sitting   Currently in Pain? No/denies   Pain Score 0-No pain                         [facility] Adult PT Treatment/Exercise - 11/23/14 0001    Knee/Hip Exercises: Aerobic   Elliptical 7fd/3back   Nustep L 6 676m   Knee/Hip Exercises: Machines for Strengthening   Cybex Knee Extension 15# 3 set 10   Cybex Knee Flexion 45lb 3 x 15   Cybex Leg Press 60lb 3x 10   Other Machine calf raises leg press 3x 10 40#                PT Education -  11/23/14 0903    Education provided Yes   Education Details quad strengthening with VMO ball squeeze and green tband   Person(s) Educated Patient   Methods Demonstration;Explanation;Handout   Comprehension Verbalized understanding;Returned demonstration          PT Short Term Goals - 11/14/14 0852    PT SHORT TERM GOAL #1   Title independent with initial HEP   Status Achieved           PT Long Term Goals - 11/23/14 0854    PT LONG TERM GOAL #1   Title decrease pain 50%   Status Achieved   PT LONG TERM GOAL #2   Title Increase ROM of the left knee to WFL's   Baseline 0-124 AROM sitting   Status Achieved   PT LONG TERM GOAL #3   Title walk up and down stairs reciprocally   Status Partially Met   PT LONG TERM GOAL #4   Title walk all distances without issue   Status Partially Met               Plan - 11/23/14 0905    Clinical Impression Statement ROM WFL and great goal  progress, issued HEP ,1 add'l visit and D/c if all is good   PT Next Visit Plan assess,check HEP and steps and D/C if all is well        Problem List Patient Active Problem List   Diagnosis Date Noted  . Tear of MCL (medial collateral ligament) of knee 09/19/2014  . Metatarsalgia of both feet 08/29/2014  . Chronic back pain 07/12/2014  . Pes anserine bursitis 07/12/2014  . Polyarthralgia 06/21/2014  . 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 Plantar fasciitis 08/26/2010  . Migraines 08/26/2010    PAYSEUR,ANGIE PTA 11/23/2014, 9:13 AM  [address] [place], [place], [address] Phone: [phone]   Fax:  [phone]  Name: Taylor [name] MRN: [id number] Date of Birth: Sep 14, 1960

## 2014-11-27 ENCOUNTER — Ambulatory Visit: Payer: 59 | Admitting: Physical Therapy

## 2014-11-30 ENCOUNTER — Encounter: Payer: Self-pay | Admitting: Physical Therapy

## 2014-11-30 ENCOUNTER — Ambulatory Visit: Payer: 59 | Attending: Orthopaedic Surgery | Admitting: Physical Therapy

## 2014-11-30 DIAGNOSIS — R262 Difficulty in walking, not elsewhere classified: Secondary | ICD-10-CM

## 2014-11-30 DIAGNOSIS — M25662 Stiffness of left knee, not elsewhere classified: Secondary | ICD-10-CM

## 2014-11-30 DIAGNOSIS — M25562 Pain in left knee: Secondary | ICD-10-CM | POA: Diagnosis present

## 2014-11-30 NOTE — Therapy (Addendum)
[place] [address] [place], [place], [address] Phone: [phone]   Fax:  [phone]  Physical Therapy Treatment  Patient Details  Name: Taylor [name] MRN: [id number] Date of Birth: October 26, 1960 No Data Recorded  Encounter Date: 11/30/2014      PT End of Session - 11/30/14 0914    Visit Number 8   PT Start Time 0847   PT Stop Time 0920   PT Time Calculation (min) 33 min      Past Medical History  Diagnosis Date  . Headaches, cluster   . Migraines   . Tear of MCL (medial collateral ligament) of knee     Past Surgical History  Procedure Laterality Date  . Gallbladder surgery    . Tonsillectomy    . Adenoidectomy    . Mouth surgery    . Rotator cuff surgery Right   . Cholecystectomy    . Knee arthroscopy with medial menisectomy Left 10/18/2014    Procedure: ARTHROSCOPY LEFT KNEE, PARTIAL MEDIAL MENISECTOMY, CHONDROPLASTY ;  Surgeon: Melrose Nakayama, MD;  Location: [place];  Service: Orthopedics;  Laterality: Left;  . Chondroplasty Left 10/18/2014    Procedure: CHONDROPLASTY;  Surgeon: Melrose Nakayama, MD;  Location: [place];  Service: Orthopedics;  Laterality: Left;    There were no vitals filed for this visit.  Visit Diagnosis:  Knee stiffness, left  Left knee pain  Difficulty walking      Subjective Assessment - 11/30/14 0850    Subjective Pt feels like the stiffness in her knee has significantly decreased, and her pain is almost diminished. She reports she has not been compliant with the new HEP, because she was walking a lot at the beach over the weekend.    Currently in Pain? Yes   Pain Score 1    Pain Location Knee                         [facility] Adult PT Treatment/Exercise - 11/30/14 0001    Ambulation/Gait   Stairs Yes   Stairs Assistance 7: Independent   Stair Management Technique No rails;Forwards   Number of Stairs 24   Gait  Comments reciprical with c/o of slight stiffness   Knee/Hip Exercises: Aerobic   Elliptical 16fd/3back   Nustep L 6 661m   Knee/Hip Exercises: Machines for Strengthening   Cybex Knee Extension 15# 3 set 10   Cybex Knee Flexion 45lb 3 x 15   Cybex Leg Press 60lb 3x 10   Other Machine calf raises leg press 3x 10 40#                PT Education - 11/30/14 0913    Education provided Yes   Education Details emphasized importance of compliance with HEP especially quad exercises at d/c   Person(s) Educated Patient   Methods Explanation   Comprehension Verbalized understanding          PT Short Term Goals - 11/14/14 0852    PT SHORT TERM GOAL #1   Title independent with initial HEP   Status Achieved           PT Long Term Goals - 11/30/14 0852    PT LONG TERM GOAL #4   Title walk all distances without issue   Status Achieved               Plan - 11/30/14 092831  Clinical Impression Statement Pt tolerated all exercises well without any complaint of increased pain.  All goals met.    PT Next Visit Plan D/C today.       PHYSICAL THERAPY DISCHARGE SUMMARY   Plan: Patient agrees to discharge.  Patient goals were met. Patient is being discharged due to meeting the stated rehab goals.  ?????       Problem List Patient Active Problem List   Diagnosis Date Noted  . Tear of MCL (medial collateral ligament) of knee 09/19/2014  . Metatarsalgia of both feet 08/29/2014  . Chronic back pain 07/12/2014  . Pes anserine bursitis 07/12/2014  . Polyarthralgia 06/21/2014  . 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 Plantar fasciitis 08/26/2010  . Migraines 08/26/2010    Jinny Sanders, PT 11/30/2014, 9:39 AM  [address] [place], [place], [address] Phone: [phone]   Fax:  [phone]  Name: Sharine Cadle MRN: [id number] Date of  Birth: Oct 11, 1960

## 2015-01-29 MED FILL — DASETTA 1-35-28 TABLET: 1-35 | 84 days supply | Qty: 112 | Fill #3

## 2015-01-29 MED FILL — IMIPRAMINE HCL 50 MG TABLET: 50 | 30 days supply | Qty: 60 | Fill #1

## 2015-03-14 MED FILL — IMIPRAMINE HCL 50 MG TABLET: 50 | 30 days supply | Qty: 60 | Fill #2

## 2015-04-08 DIAGNOSIS — M25561 Pain in right knee: Secondary | ICD-10-CM | POA: Diagnosis not present

## 2015-04-09 ENCOUNTER — Other Ambulatory Visit: Payer: Self-pay | Admitting: Orthopaedic Surgery

## 2015-04-09 DIAGNOSIS — M25561 Pain in right knee: Secondary | ICD-10-CM

## 2015-04-12 ENCOUNTER — Ambulatory Visit (HOSPITAL_COMMUNITY)
Admission: RE | Admit: 2015-04-12 | Discharge: 2015-04-12 | Disposition: A | Discharge status: Home / Self Care | Payer: 59 | Source: Ambulatory Visit | Attending: Orthopaedic Surgery | Admitting: Orthopaedic Surgery

## 2015-04-12 ENCOUNTER — Ambulatory Visit: Payer: 59 | Admitting: Family

## 2015-04-12 DIAGNOSIS — X58XXXA Exposure to other specified factors, initial encounter: Secondary | ICD-10-CM | POA: Insufficient documentation

## 2015-04-12 DIAGNOSIS — M23303 Other meniscus derangements, unspecified medial meniscus, right knee: Secondary | ICD-10-CM | POA: Diagnosis not present

## 2015-04-12 DIAGNOSIS — S83231A Complex tear of medial meniscus, current injury, right knee, initial encounter: Secondary | ICD-10-CM | POA: Insufficient documentation

## 2015-04-12 DIAGNOSIS — M7121 Synovial cyst of popliteal space [Baker], right knee: Secondary | ICD-10-CM | POA: Insufficient documentation

## 2015-04-12 DIAGNOSIS — M25561 Pain in right knee: Secondary | ICD-10-CM | POA: Diagnosis not present

## 2015-04-12 DIAGNOSIS — M25461 Effusion, right knee: Secondary | ICD-10-CM | POA: Insufficient documentation

## 2015-04-12 DIAGNOSIS — M67461 Ganglion, right knee: Secondary | ICD-10-CM | POA: Diagnosis not present

## 2015-04-12 MED FILL — IMIPRAMINE HCL 50 MG TABLET: 50 | 30 days supply | Qty: 60 | Fill #3

## 2015-04-17 ENCOUNTER — Encounter (HOSPITAL_BASED_OUTPATIENT_CLINIC_OR_DEPARTMENT_OTHER): Payer: Self-pay | Admitting: *Deleted

## 2015-04-17 ENCOUNTER — Other Ambulatory Visit: Payer: Self-pay | Admitting: Orthopaedic Surgery

## 2015-04-17 DIAGNOSIS — M25561 Pain in right knee: Secondary | ICD-10-CM | POA: Diagnosis not present

## 2015-04-17 NOTE — H&P (Signed)
Taylor LisKimberly Jean [name] is an 55 y.o. female.   Chief Complaint: Right knee HPI: Taylor [name] [name] with right knee [name].  She still says it feels just like the opposite side did last year.  She has been through an MRI scan since last time she was in.  The [name] is intermittent and severe.  It makes walking difficult.  She has trouble resting at night.    MRI:  I reviewed an MRI scan films and [facility].  This shows an inferior surface medial meniscus tear with a displaced flap of tissue.  Past Medical History  Diagnosis Date  . Headaches, cluster   . Migraines   . Tear of MCL (medial collateral ligament) of knee     Past Surgical History  Procedure Laterality Date  . Gallbladder surgery    . Tonsillectomy    . Adenoidectomy    . Mouth surgery    . Rotator cuff surgery Right   . Cholecystectomy    . Knee arthroscopy with medial menisectomy Left 10/18/2014    Procedure: ARTHROSCOPY LEFT KNEE, PARTIAL MEDIAL MENISECTOMY, CHONDROPLASTY ;  Surgeon: Marcene CorningPeter Dalldorf, MD;  Location: [facility];  Service: Orthopedics;  Laterality: Left;  . Chondroplasty Left 10/18/2014    Procedure: CHONDROPLASTY;  Surgeon: Marcene CorningPeter Dalldorf, MD;  [facility];  Service: Orthopedics;  Laterality: Left;    History reviewed. No pertinent family history. Social History:  reports that she has never smoked. She has never used smokeless tobacco. She reports that she does not drink alcohol or use illicit drugs.  Allergies:  Allergies  Allergen Reactions  . Bee Venom Hives  . Sulfa Antibiotics Hives    No prescriptions prior to admission    No results found for this or any previous visit (from the past 48 hour(s)). No results found.  Review of Systems  Musculoskeletal: Positive for joint [name].       Right knee  All other systems reviewed and are negative.   Height 5\' 4"  (1.626 m), weight 95.255 kg (210 lb). Physical Exam  Constitutional:  She is oriented to person, place, and time. She appears well-developed and well-nourished.  HENT:  Head: Normocephalic and atraumatic.  Eyes: Conjunctivae are normal. Pupils are equal, round, and reactive to light.  Neck: Normal range of motion.  Cardiovascular: Normal rate and regular rhythm.   Respiratory: Effort normal.  GI: Soft.  Musculoskeletal:  Right knee has trace effusion.  Her motion is from 0-100 at which point she has terrible [name].  She has exquisite [name] to palpation on the medial joint line.  McMurray's test is positive for pop and [name] in the medial direction.  Ligaments are stable.   Hip motion is full and painfree and SLR is negative on both sides.  There is no palpable LAD behind either knee.  Sensation and motor function are intact on both sides and there are palpable pulses on both sides.   Neurological: She is alert and oriented to person, place, and time.  Skin: Skin is warm and dry.  Psychiatric: She has a normal mood and affect. Her behavior is normal. Judgment and thought content normal.     Assessment/Plan Assessment: Right knee torn medial meniscus by MRI  Plan: Taylor [name] [name] with some significant [name] and disability related to her right knee torn meniscus.  I think we can improve her situation somewhat with an arthroscopy. I reviewed risks of anesthesia and infection as well  as potential for DVT related to a knee arthroscopy.  I've stressed the importance of some postoperative physical therapy to optimize results and we will try to set up an appointment.  Two to four  weeks for recovery would be typical but that is a little variable.    Taylor [name], Ginger Organ, PA-C 04/17/2015, 1:28 PM

## 2015-04-18 ENCOUNTER — Encounter (HOSPITAL_BASED_OUTPATIENT_CLINIC_OR_DEPARTMENT_OTHER)
Admission: RE | Disposition: A | Discharge status: Home / Self Care | Payer: Self-pay | Source: Ambulatory Visit | Attending: Orthopaedic Surgery

## 2015-04-18 ENCOUNTER — Encounter (HOSPITAL_BASED_OUTPATIENT_CLINIC_OR_DEPARTMENT_OTHER): Payer: Self-pay

## 2015-04-18 ENCOUNTER — Ambulatory Visit (HOSPITAL_BASED_OUTPATIENT_CLINIC_OR_DEPARTMENT_OTHER): Payer: 59 | Admitting: Certified Registered"

## 2015-04-18 ENCOUNTER — Ambulatory Visit (HOSPITAL_BASED_OUTPATIENT_CLINIC_OR_DEPARTMENT_OTHER)
Admission: RE | Admit: 2015-04-18 | Discharge: 2015-04-18 | Disposition: A | Discharge status: Home / Self Care | Payer: 59 | Source: Ambulatory Visit | Attending: Orthopaedic Surgery | Admitting: Orthopaedic Surgery

## 2015-04-18 DIAGNOSIS — S83241A Other tear of medial meniscus, current injury, right knee, initial encounter: Secondary | ICD-10-CM | POA: Insufficient documentation

## 2015-04-18 DIAGNOSIS — M2241 Chondromalacia patellae, right knee: Secondary | ICD-10-CM | POA: Diagnosis not present

## 2015-04-18 DIAGNOSIS — M23331 Other meniscus derangements, other medial meniscus, right knee: Secondary | ICD-10-CM | POA: Diagnosis not present

## 2015-04-18 HISTORY — PX: KNEE ARTHROSCOPY WITH MEDIAL MENISECTOMY: SHX5651

## 2015-04-18 SURGERY — ARTHROSCOPY, KNEE, WITH MEDIAL MENISCECTOMY
Anesthesia: General | Site: Knee | Laterality: Right

## 2015-04-18 MED ORDER — LACTATED RINGERS IV SOLN
INTRAVENOUS | Status: DC
  Administered 2015-04-18 (×2): via INTRAVENOUS

## 2015-04-18 MED ORDER — FENTANYL CITRATE (PF) 100 MCG/2ML IJ SOLN
50.0000 ug | INTRAMUSCULAR | Status: DC | PRN
  Administered 2015-04-18: 100 ug via INTRAVENOUS

## 2015-04-18 MED ORDER — MORPHINE SULFATE 4 MG/ML IJ SOLN
INTRAMUSCULAR | Status: DC | PRN
  Administered 2015-04-18: 20 mL via INTRA_ARTICULAR

## 2015-04-18 MED ORDER — MORPHINE SULFATE (PF) 4 MG/ML IV SOLN
INTRAVENOUS | Status: AC
  Filled 2015-04-18: qty 1

## 2015-04-18 MED ORDER — ONDANSETRON HCL 4 MG/2ML IJ SOLN
INTRAMUSCULAR | Status: AC
Start: 2015-04-18 — End: 2015-04-18
  Filled 2015-04-18: qty 2

## 2015-04-18 MED ORDER — BUPIVACAINE-EPINEPHRINE (PF) 0.5% -1:200000 IJ SOLN
INTRAMUSCULAR | Status: AC
Start: 2015-04-18 — End: 2015-04-18
  Filled 2015-04-18: qty 30

## 2015-04-18 MED ORDER — HYDROMORPHONE HCL 1 MG/ML IJ SOLN
INTRAMUSCULAR | Status: AC
  Filled 2015-04-18: qty 1

## 2015-04-18 MED ORDER — ONDANSETRON HCL 4 MG/2ML IJ SOLN
INTRAMUSCULAR | Status: DC | PRN
  Administered 2015-04-18: 4 mg via INTRAVENOUS

## 2015-04-18 MED ORDER — CEFAZOLIN SODIUM-DEXTROSE 2-4 GM/100ML-% IV SOLN
INTRAVENOUS | Status: AC
  Filled 2015-04-18: qty 100

## 2015-04-18 MED ORDER — SODIUM CHLORIDE 0.9 % IR SOLN
Status: DC | PRN
  Administered 2015-04-18: 3000 mL

## 2015-04-18 MED ORDER — PROPOFOL 500 MG/50ML IV EMUL
INTRAVENOUS | Status: AC
  Filled 2015-04-18: qty 50

## 2015-04-18 MED ORDER — OXYCODONE-ACETAMINOPHEN 5-325 MG PO TABS
ORAL_TABLET | ORAL | Status: AC
Start: 2015-04-18 — End: 2015-04-18
  Filled 2015-04-18: qty 2

## 2015-04-18 MED ORDER — FENTANYL CITRATE (PF) 100 MCG/2ML IJ SOLN
INTRAMUSCULAR | Status: AC
  Filled 2015-04-18: qty 2

## 2015-04-18 MED ORDER — LIDOCAINE HCL (CARDIAC) 20 MG/ML IV SOLN
INTRAVENOUS | Status: AC
  Filled 2015-04-18: qty 5

## 2015-04-18 MED ORDER — METHYLPREDNISOLONE ACETATE 40 MG/ML IJ SUSP
INTRAMUSCULAR | Status: AC
Start: 2015-04-18 — End: 2015-04-18
  Filled 2015-04-18: qty 1

## 2015-04-18 MED ORDER — CEFAZOLIN SODIUM-DEXTROSE 2-4 GM/100ML-% IV SOLN
2.0000 g | INTRAVENOUS | Status: AC
  Administered 2015-04-18: 2 g via INTRAVENOUS

## 2015-04-18 MED ORDER — KETOROLAC TROMETHAMINE 30 MG/ML IJ SOLN
INTRAMUSCULAR | Status: DC | PRN
  Administered 2015-04-18: 30 mg via INTRAVENOUS

## 2015-04-18 MED ORDER — GLYCOPYRROLATE 0.2 MG/ML IJ SOLN: 0.2000 mg | Freq: Once | INTRAMUSCULAR | Status: DC | PRN

## 2015-04-18 MED ORDER — KETOROLAC TROMETHAMINE 30 MG/ML IJ SOLN
INTRAMUSCULAR | Status: AC
  Filled 2015-04-18: qty 1

## 2015-04-18 MED ORDER — LIDOCAINE HCL (CARDIAC) 20 MG/ML IV SOLN
INTRAVENOUS | Status: DC | PRN
  Administered 2015-04-18: 80 mg via INTRAVENOUS

## 2015-04-18 MED ORDER — PROPOFOL 10 MG/ML IV BOLUS
INTRAVENOUS | Status: DC | PRN
Start: 2015-04-18 — End: 2015-04-18
  Administered 2015-04-18: 160 mg via INTRAVENOUS

## 2015-04-18 MED ORDER — MEPERIDINE HCL 25 MG/ML IJ SOLN: 6.2500 mg | INTRAMUSCULAR | Status: DC | PRN

## 2015-04-18 MED ORDER — SCOPOLAMINE 1 MG/3DAYS TD PT72: 1.0000 | MEDICATED_PATCH | Freq: Once | TRANSDERMAL | Status: DC | PRN

## 2015-04-18 MED ORDER — MIDAZOLAM HCL 2 MG/2ML IJ SOLN
INTRAMUSCULAR | Status: AC
  Filled 2015-04-18: qty 2

## 2015-04-18 MED ORDER — DEXAMETHASONE SODIUM PHOSPHATE 4 MG/ML IJ SOLN
INTRAMUSCULAR | Status: DC | PRN
  Administered 2015-04-18: 10 mg via INTRAVENOUS

## 2015-04-18 MED ORDER — DEXAMETHASONE SODIUM PHOSPHATE 10 MG/ML IJ SOLN
INTRAMUSCULAR | Status: AC
  Filled 2015-04-18: qty 1

## 2015-04-18 MED ORDER — MIDAZOLAM HCL 2 MG/2ML IJ SOLN
1.0000 mg | INTRAMUSCULAR | Status: DC | PRN
  Administered 2015-04-18: 2 mg via INTRAVENOUS

## 2015-04-18 MED ORDER — PHENYLEPHRINE HCL 10 MG/ML IJ SOLN
INTRAMUSCULAR | Status: DC | PRN
  Administered 2015-04-18 (×2): 80 ug via INTRAVENOUS

## 2015-04-18 MED ORDER — HYDROMORPHONE HCL 1 MG/ML IJ SOLN
0.2500 mg | INTRAMUSCULAR | Status: DC | PRN
  Administered 2015-04-18 (×2): 0.25 mg via INTRAVENOUS
  Administered 2015-04-18: 0.5 mg via INTRAVENOUS

## 2015-04-18 MED ORDER — OXYCODONE-ACETAMINOPHEN 5-325 MG PO TABS
1.0000 | ORAL_TABLET | Freq: Once | ORAL | Status: AC | PRN
  Administered 2015-04-18: 2 via ORAL

## 2015-04-18 MED ORDER — METHYLPREDNISOLONE ACETATE 80 MG/ML IJ SUSP
INTRAMUSCULAR | Status: AC
  Filled 2015-04-18: qty 1

## 2015-04-18 MED ORDER — LACTATED RINGERS IV SOLN
INTRAVENOUS | Status: DC
  Administered 2015-04-18: 13:00:00 via INTRAVENOUS

## 2015-04-18 MED ORDER — ONDANSETRON HCL 4 MG/2ML IJ SOLN: 4.0000 mg | Freq: Once | INTRAMUSCULAR | Status: DC | PRN

## 2015-04-18 MED ORDER — OXYCODONE-ACETAMINOPHEN 5-325 MG PO TABS: 1.0000 | ORAL_TABLET | ORAL | Status: AC | PRN

## 2015-04-18 MED ORDER — FENTANYL CITRATE (PF) 100 MCG/2ML IJ SOLN
INTRAMUSCULAR | Status: AC
Start: 2015-04-18 — End: 2015-04-18
  Filled 2015-04-18: qty 2

## 2015-04-18 MED ORDER — POVIDONE-IODINE 7.5 % EX SOLN: Freq: Once | CUTANEOUS | Status: DC

## 2015-04-18 MED FILL — OXYCODONE/APAP 5-325: 5-325 | 3 days supply | Qty: 40 | Fill #0

## 2015-04-18 SURGICAL SUPPLY — 41 items
BANDAGE ACE 6X5 VEL STRL LF (GAUZE/BANDAGES/DRESSINGS) ×3 IMPLANT
BLADE CUDA 5.5 (BLADE) IMPLANT
BLADE CUTTER GATOR 3.5 (BLADE) ×3 IMPLANT
BLADE GREAT WHITE 4.2 (BLADE) IMPLANT
BLADE GREAT WHITE 4.2MM (BLADE)
BNDG COHESIVE 6X5 TAN STRL LF (GAUZE/BANDAGES/DRESSINGS) ×3 IMPLANT
BNDG GAUZE ELAST 4 BULKY (GAUZE/BANDAGES/DRESSINGS) ×3 IMPLANT
DRAPE ARTHROSCOPY W/POUCH 114 (DRAPES) ×3 IMPLANT
DRAPE U-SHAPE 47X51 STRL (DRAPES) ×3 IMPLANT
DRSG EMULSION OIL 3X3 NADH (GAUZE/BANDAGES/DRESSINGS) ×3 IMPLANT
DURAPREP 26ML APPLICATOR (WOUND CARE) ×3 IMPLANT
ELECT MENISCUS 165MM 90D (ELECTRODE) IMPLANT
ELECT REM PT RETURN 9FT ADLT (ELECTROSURGICAL)
ELECTRODE REM PT RTRN 9FT ADLT (ELECTROSURGICAL) IMPLANT
GAUZE SPONGE 4X4 12PLY STRL (GAUZE/BANDAGES/DRESSINGS) ×3 IMPLANT
GLOVE BIO SURGEON STRL SZ8 (GLOVE) ×6 IMPLANT
GLOVE BIOGEL PI IND STRL 7.0 (GLOVE) ×1 IMPLANT
GLOVE BIOGEL PI IND STRL 8 (GLOVE) ×2 IMPLANT
GLOVE BIOGEL PI INDICATOR 7.0 (GLOVE) ×2
GLOVE BIOGEL PI INDICATOR 8 (GLOVE) ×4
GLOVE ECLIPSE 6.5 STRL STRAW (GLOVE) ×3 IMPLANT
GLOVE EXAM NITRILE EXT CUFF MD (GLOVE) ×3 IMPLANT
GOWN STRL REUS W/ TWL LRG LVL3 (GOWN DISPOSABLE) ×1 IMPLANT
GOWN STRL REUS W/ TWL XL LVL3 (GOWN DISPOSABLE) ×2 IMPLANT
GOWN STRL REUS W/TWL LRG LVL3 (GOWN DISPOSABLE) ×2
GOWN STRL REUS W/TWL XL LVL3 (GOWN DISPOSABLE) ×4
IV NS IRRIG 3000ML ARTHROMATIC (IV SOLUTION) ×3 IMPLANT
KNEE WRAP E Z 3 GEL PACK (MISCELLANEOUS) ×3 IMPLANT
MANIFOLD NEPTUNE II (INSTRUMENTS) ×3 IMPLANT
PACK ARTHROSCOPY DSU (CUSTOM PROCEDURE TRAY) ×3 IMPLANT
PACK BASIN DAY SURGERY FS (CUSTOM PROCEDURE TRAY) ×3 IMPLANT
PENCIL BUTTON HOLSTER BLD 10FT (ELECTRODE) IMPLANT
SET ARTHROSCOPY TUBING (MISCELLANEOUS) ×2
SET ARTHROSCOPY TUBING LN (MISCELLANEOUS) ×1 IMPLANT
SHEET MEDIUM DRAPE 40X70 STRL (DRAPES) ×3 IMPLANT
SYR 3ML 18GX1 1/2 (SYRINGE) IMPLANT
TOWEL OR 17X24 6PK STRL BLUE (TOWEL DISPOSABLE) ×3 IMPLANT
TOWEL OR NON WOVEN STRL DISP B (DISPOSABLE) IMPLANT
WAND 30 DEG SABER W/CORD (SURGICAL WAND) IMPLANT
WAND STAR VAC 90 (SURGICAL WAND) IMPLANT
WATER STERILE IRR 1000ML POUR (IV SOLUTION) ×3 IMPLANT

## 2015-04-18 NOTE — Op Note (Signed)
#  [id number] 

## 2015-04-18 NOTE — Discharge Instructions (Signed)

## 2015-04-18 NOTE — Transfer of Care (Signed)
Immediate Anesthesia Transfer of Care Note  Patient: Taylor [name]  Procedure(s) Performed: Procedure(s) with comments: KNEE ARTHROSCOPY WITH MEDIAL MENISECTOMY (Right) - Right knee arthroscopy with medical menisectomy  Patient Location: PACU  Anesthesia Type:General  Level of Consciousness: sedated  Airway & Oxygen Therapy: Patient Spontanous Breathing and Patient connected to face mask oxygen  Post-op Assessment: Report given to RN and Post -op Vital signs reviewed and stable  Post vital signs: Reviewed and stable  Last Vitals:  Filed Vitals:   04/18/15 1502 04/18/15 1503  BP: 78/42   Pulse:  87  Temp:    Resp:  9    Complications: No apparent anesthesia complications

## 2015-04-18 NOTE — Anesthesia Procedure Notes (Signed)
Procedure Name: LMA Insertion Date/Time: 04/18/2015 2:29 PM Performed by: Burna CashONRAD, Cardelia Sassano C Pre-anesthesia Checklist: Patient identified, Emergency Drugs available, Suction available and Patient being monitored Patient Re-evaluated:Patient Re-evaluated prior to inductionOxygen Delivery Method: Circle System Utilized Preoxygenation: Pre-oxygenation with 100% oxygen Intubation Type: IV induction Ventilation: Mask ventilation without difficulty LMA: LMA inserted LMA Size: 4.0 Number of attempts: 1 Airway Equipment and Method: bite block Placement Confirmation: positive ETCO2 Tube secured with: Tape Dental Injury: Teeth and Oropharynx as per pre-operative assessment

## 2015-04-18 NOTE — Anesthesia Postprocedure Evaluation (Signed)
Anesthesia Post Note  Patient: Anselm LisKimberly Jean Sessler  Procedure(s) Performed: Procedure(s) (LRB): KNEE ARTHROSCOPY WITH MEDIAL MENISECTOMY (Right)  Patient location during evaluation: PACU Anesthesia Type: General Level of consciousness: awake and alert Pain management: pain level controlled Vital Signs Assessment: post-procedure vital signs reviewed and stable Respiratory status: spontaneous breathing, nonlabored ventilation, respiratory function stable and patient connected to nasal cannula oxygen Cardiovascular status: blood pressure returned to baseline and stable Postop Assessment: no signs of nausea or vomiting Anesthetic complications: no    Last Vitals:  Filed Vitals:   04/18/15 1600 04/18/15 1627  BP: 132/82 125/67  Pulse: 112 106  Temp:  36.6 C  Resp: 16 16    Last Pain:  Filed Vitals:   04/18/15 1628  PainSc: 1         RLE Motor Response: Purposeful movement (04/18/15 1627) RLE Sensation: Full sensation (04/18/15 1627)      Curvin Hunger DAVID

## 2015-04-18 NOTE — Anesthesia Preprocedure Evaluation (Signed)

## 2015-04-18 NOTE — Interval H&P Note (Signed)
OK for surgery PD 

## 2015-04-19 ENCOUNTER — Encounter (HOSPITAL_BASED_OUTPATIENT_CLINIC_OR_DEPARTMENT_OTHER): Payer: Self-pay | Admitting: Orthopaedic Surgery

## 2015-04-19 NOTE — Op Note (Signed)
NAMEnriqueta [name]:  [name], Taylor             ACCOUNT NO.:  [id number]  MEDICAL RECORD NO.:  [id number]  LOCATION:                                 FACILITY:  PHYSICIAN:  Lubertha Basqueeter G. Cambry Spampinato, M.D.DATE OF BIRTH:  01/28/1960  DATE OF PROCEDURE:  04/18/2015 DATE OF DISCHARGE:                              OPERATIVE REPORT   PREOPERATIVE DIAGNOSIS:  Right knee torn medial meniscus.  POSTOPERATIVE DIAGNOSIS:  Right knee torn medial meniscus.  PROCEDURE:  Right knee partial medial meniscectomy.  ANESTHESIA:  General.  ATTENDING SURGEON:  Lubertha BasquePeter G. Jerl Santosalldorf, M.D.  ASSISTANT:  Elodia FlorenceAndrew Nida, PA.  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  This has persisted despite some conservative measures.  By MRI scan, she has a flipped medial meniscus tear.  This is an identical tear to the one she experienced in the office in the last year.  Nevertheless, she has pain which limits her ability to rest and walk and she is offered an arthroscopy.  Informed operative consent was obtained after discussion of possible complications including reaction to anesthesia and infection.  SUMMARY OF FINDINGS AND PROCEDURE:  Under general anesthesia, a right knee arthroscopy was performed.  The suprapatellar pouch was benign as was the patellofemoral joint.  Medial compartment exhibited a tear of the medial meniscus which was incarcerated underneath this structure. This was pulled into the knee and then resected taking about 10% of the medial meniscus in the process.  There were no degenerative changes in this compartment.  The ACL was normal, and the lateral compartment was benign with no evidence of meniscal or articular cartilage injury.  She was discharged home the same day.  DESCRIPTION OF PROCEDURE:  The patient was taken to the operating suite where general anesthetic was applied without difficulty.  She was positioned supine and prepped and draped in normal  sterile fashion. After the administration of preop IV Kefzol and an appropriate time-out, an arthroscopy of the right knee was performed through a total of 2 portals.  Findings were as noted above.  Procedure consisted of the partial medial meniscectomy which was done with a basket and shaver.  We took this back to a stable rim.  The knee was thoroughly irrigated, followed by placement of Marcaine with epinephrine and morphine. Adaptic was placed on the portals, followed by dry gauze and a loose Ace wrap.  Estimated blood loss and intraoperative fluids can be obtained from anesthesia records.  DISPOSITION:  The patient was extubated in the operating room and taken to recovery room in stable condition.  She was to go home the same-day and follow up in the office in less than a week.  I will contact her by phone tonight.     Lubertha BasquePeter G. Jerl Santosalldorf, M.D.     PGD/MEDQ  D:  04/18/2015  T:  04/18/2015  Job:  [id number]

## 2015-04-26 MED FILL — DASETTA 1-35-28 TABLET: 1-35 | 84 days supply | Qty: 112 | Fill #0 | Status: TO

## 2015-04-29 DIAGNOSIS — Z9889 Other specified postprocedural states: Secondary | ICD-10-CM | POA: Diagnosis not present

## 2015-05-20 DIAGNOSIS — Z9889 Other specified postprocedural states: Secondary | ICD-10-CM | POA: Diagnosis not present

## 2015-05-27 DIAGNOSIS — Z79899 Other long term (current) drug therapy: Secondary | ICD-10-CM | POA: Diagnosis not present

## 2015-05-27 DIAGNOSIS — R51 Headache: Secondary | ICD-10-CM | POA: Diagnosis not present

## 2015-05-27 DIAGNOSIS — G43019 Migraine without aura, intractable, without status migrainosus: Secondary | ICD-10-CM | POA: Diagnosis not present

## 2015-05-27 DIAGNOSIS — G43719 Chronic migraine without aura, intractable, without status migrainosus: Secondary | ICD-10-CM | POA: Diagnosis not present

## 2015-05-27 MED FILL — IMIPRAMINE HCL 50 MG TABLET: 50 | 30 days supply | Qty: 60 | Fill #0 | Status: TO

## 2015-06-22 ENCOUNTER — Ambulatory Visit (INDEPENDENT_AMBULATORY_CARE_PROVIDER_SITE_OTHER): Payer: 59 | Admitting: Physician Assistant

## 2015-06-22 VITALS — BP 120/86 | HR 121 | Temp 98.1°F | Resp 18 | Ht 64.0 in | Wt 206.4 lb

## 2015-06-22 DIAGNOSIS — A499 Bacterial infection, unspecified: Secondary | ICD-10-CM | POA: Diagnosis not present

## 2015-06-22 DIAGNOSIS — B9689 Other specified bacterial agents as the cause of diseases classified elsewhere: Secondary | ICD-10-CM

## 2015-06-22 DIAGNOSIS — J329 Chronic sinusitis, unspecified: Secondary | ICD-10-CM

## 2015-06-22 MED ORDER — HYDROCOD POLST-CPM POLST ER 10-8 MG/5ML PO SUER: 5.0000 mL | Freq: Two times a day (BID) | ORAL | Status: AC | PRN

## 2015-06-22 MED ORDER — AMOXICILLIN-POT CLAVULANATE 875-125 MG PO TABS: 1.0000 | ORAL_TABLET | Freq: Two times a day (BID) | ORAL | Status: AC

## 2015-06-22 MED ORDER — GUAIFENESIN ER 1200 MG PO TB12: 1.0000 | ORAL_TABLET | Freq: Two times a day (BID) | ORAL | Status: AC | PRN

## 2015-06-22 MED ORDER — IPRATROPIUM BROMIDE 0.03 % NA SOLN: 2.0000 | Freq: Two times a day (BID) | NASAL | Status: AC

## 2015-06-22 NOTE — Patient Instructions (Addendum)
Get plenty of rest and drink at least 64 ounces of water daily.     IF you received an x-ray today, you will receive an invoice from [facility]. Please contact [facility] at [phone] with questions or concerns regarding your invoice.   IF you received labwork today, you will receive an invoice from [facility]. Please contact [facility] at [phone] with questions or concerns regarding your invoice.   Our billing staff will not be able to assist you with questions regarding bills from these companies.  You will be contacted with the lab results as soon as they are available. The fastest way to get your results is to activate your My Chart account. Instructions are located on the last page of this paperwork. If you have not heard from us regarding the results in 2 weeks, please contact this office.     

## 2015-06-22 NOTE — Progress Notes (Signed)
Patient ID: Taylor [name], female     DOB: 07/26/1960, 55 y.o.    MRN: [id number]  PCP: Simone CuriaLEE,KEUNG, MD  Chief Complaint  Patient presents with  . Sinusitis    x1 week.     Subjective:    HPI  Presents for evaluation of possible sinusitis. Coughing constantly, keeping her awake. Thick mucous. Changed from clear to green. Sinus/facial pressure and pain.  Reports that the imipramine for migraine prevention causes the tachycardia, which is not new.   Prior to Admission medications   Medication Sig Start Date End Date Taking? Authorizing Provider  imipramine (TOFRANIL-PM) 100 MG capsule Take 100 mg by mouth at bedtime.   Yes Historical Provider, MD  norethindrone-ethinyl estradiol 1/35 (ORTHO-NOVUM, NORTREL,CYCLAFEM) tablet Take 1 tablet by mouth daily.   Yes Historical Provider, MD  chlorproMAZINE (THORAZINE) 25 MG tablet Take 25-50 mg by mouth daily as needed (migraines). Reported on 06/22/2015    Historical Provider, MD  oxyCODONE-acetaminophen (ROXICET) 5-325 MG tablet Take 1-2 tablets by mouth every 4 (four) hours as needed for severe pain. Patient not taking: Reported on 06/22/2015 04/18/15   Elodia FlorenceAndrew Nida, PA-C     Allergies  Allergen Reactions  . Bee Venom Hives  . Sulfa Antibiotics Hives     Patient Active Problem List   Diagnosis Date Noted  . Tear of MCL (medial collateral ligament) of knee 09/19/2014  . Metatarsalgia of both feet 08/29/2014  . Chronic back pain 07/12/2014  . Pes anserine bursitis 07/12/2014  . Polyarthralgia 06/21/2014  . 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 Plantar fasciitis 08/26/2010  . Migraines 08/26/2010     History reviewed. No pertinent family history.   Social History   Social History  . Marital Status: Married    Spouse Name: IT trainerDevakumar  . Number of Children: 7  . Years of Education: N/A   Occupational History  . RN    Social History Main Topics  . Smoking status: Never Smoker   . Smokeless  tobacco: Never Used  . Alcohol Use: No  . Drug Use: No  . Sexual Activity: Yes    Birth Control/ Protection: Pill   Other Topics Concern  . Not on file   Social History Narrative   [place]   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   Her 7 children are grown and live independently.        Review of Systems As above.      Objective:  Physical Exam  Constitutional: She is oriented to person, place, and time. She appears well-developed and well-nourished. No distress.  BP 120/86 mmHg  Pulse 121  Temp(Src) 98.1 F (36.7 C) (Oral)  Resp 18  Ht 5\' 4"  (1.626 m)  Wt 206 lb 6.4 oz (93.622 kg)  BMI 35.41 kg/m2  SpO2 97%   HENT:  Head: Normocephalic and atraumatic.  Right Ear: Hearing, tympanic membrane, external ear and ear canal normal.  Left Ear: Hearing, tympanic membrane, external ear and ear canal normal.  Nose: Mucosal edema and rhinorrhea present.  No foreign bodies. Right sinus exhibits maxillary sinus tenderness and frontal sinus tenderness. Left sinus exhibits maxillary sinus tenderness and frontal sinus tenderness.  Mouth/Throat: Uvula is midline, oropharynx is clear and moist and mucous membranes are normal. No uvula swelling. No oropharyngeal exudate.  Eyes: Conjunctivae and EOM are normal. Pupils are equal, round, and reactive to light. Right  eye exhibits no discharge. Left eye exhibits no discharge. No scleral icterus.  Neck: Trachea normal, normal range of motion and full passive range of motion without pain. Neck supple. No thyroid mass and no thyromegaly present.  Cardiovascular: Normal rate, regular rhythm and normal heart sounds.   Pulmonary/Chest: Effort normal and breath sounds normal.  Lymphadenopathy:       Head (right side): No submandibular, no tonsillar, no preauricular, no posterior auricular and no occipital adenopathy present.       Head (left side):  No submandibular, no tonsillar, no preauricular and no occipital adenopathy present.    She has no cervical adenopathy.       Right: No supraclavicular adenopathy present.       Left: No supraclavicular adenopathy present.  Neurological: She is alert and oriented to person, place, and time. She has normal strength. No cranial nerve deficit or sensory deficit.  Skin: Skin is warm, dry and intact. No rash noted.  Psychiatric: She has a normal mood and affect. Her speech is normal and behavior is normal.             Assessment & Plan:  1. Sinusitis, bacterial Supportive care.  Anticipatory guidance.  RTC if symptoms worsen/persist. - Guaifenesin (MUCINEX MAXIMUM STRENGTH) 1200 MG TB12; Take 1 tablet (1,200 mg total) by mouth every 12 (twelve) hours as needed.  Dispense: 14 tablet; Refill: 1 - ipratropium (ATROVENT) 0.03 % nasal spray; Place 2 sprays into both nostrils 2 (two) times daily.  Dispense: 30 mL; Refill: 0 - amoxicillin-clavulanate (AUGMENTIN) 875-125 MG tablet; Take 1 tablet by mouth 2 (two) times daily.  Dispense: 20 tablet; Refill: 0 - chlorpheniramine-HYDROcodone (TUSSIONEX PENNKINETIC ER) 10-8 MG/5ML SUER; Take 5 mLs by mouth every 12 (twelve) hours as needed for cough.  Dispense: 100 mL; Refill: 0   Fernande Bras, PA-C Physician Assistant-Certified [facility]

## 2015-11-23 IMAGING — CR DG KNEE COMPLETE 4+V*L*
4 series · 4 of 4 positions shown · non-contrast
Comparison: None.

CLINICAL DATA: Acute onset left knee pain today. Heard a pop.
Medial and anterior pain. Swelling. Initial encounter.

EXAM:
LEFT KNEE - COMPLETE 4+ VIEW

[x knee ap left (1 of 3)]
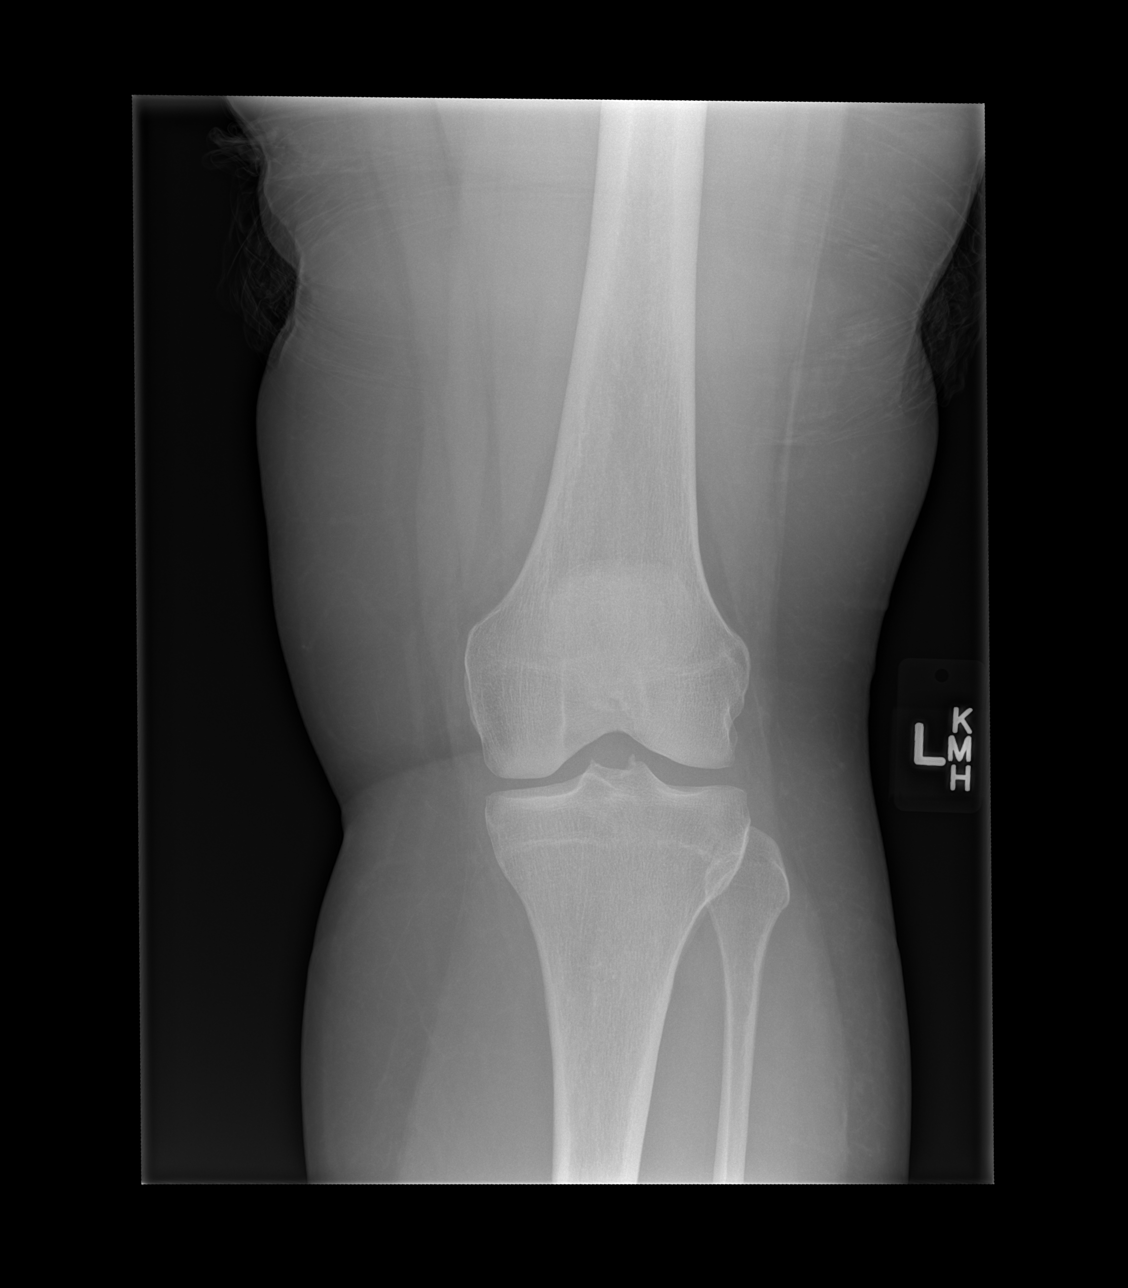

[x knee ap left (2 of 3)]
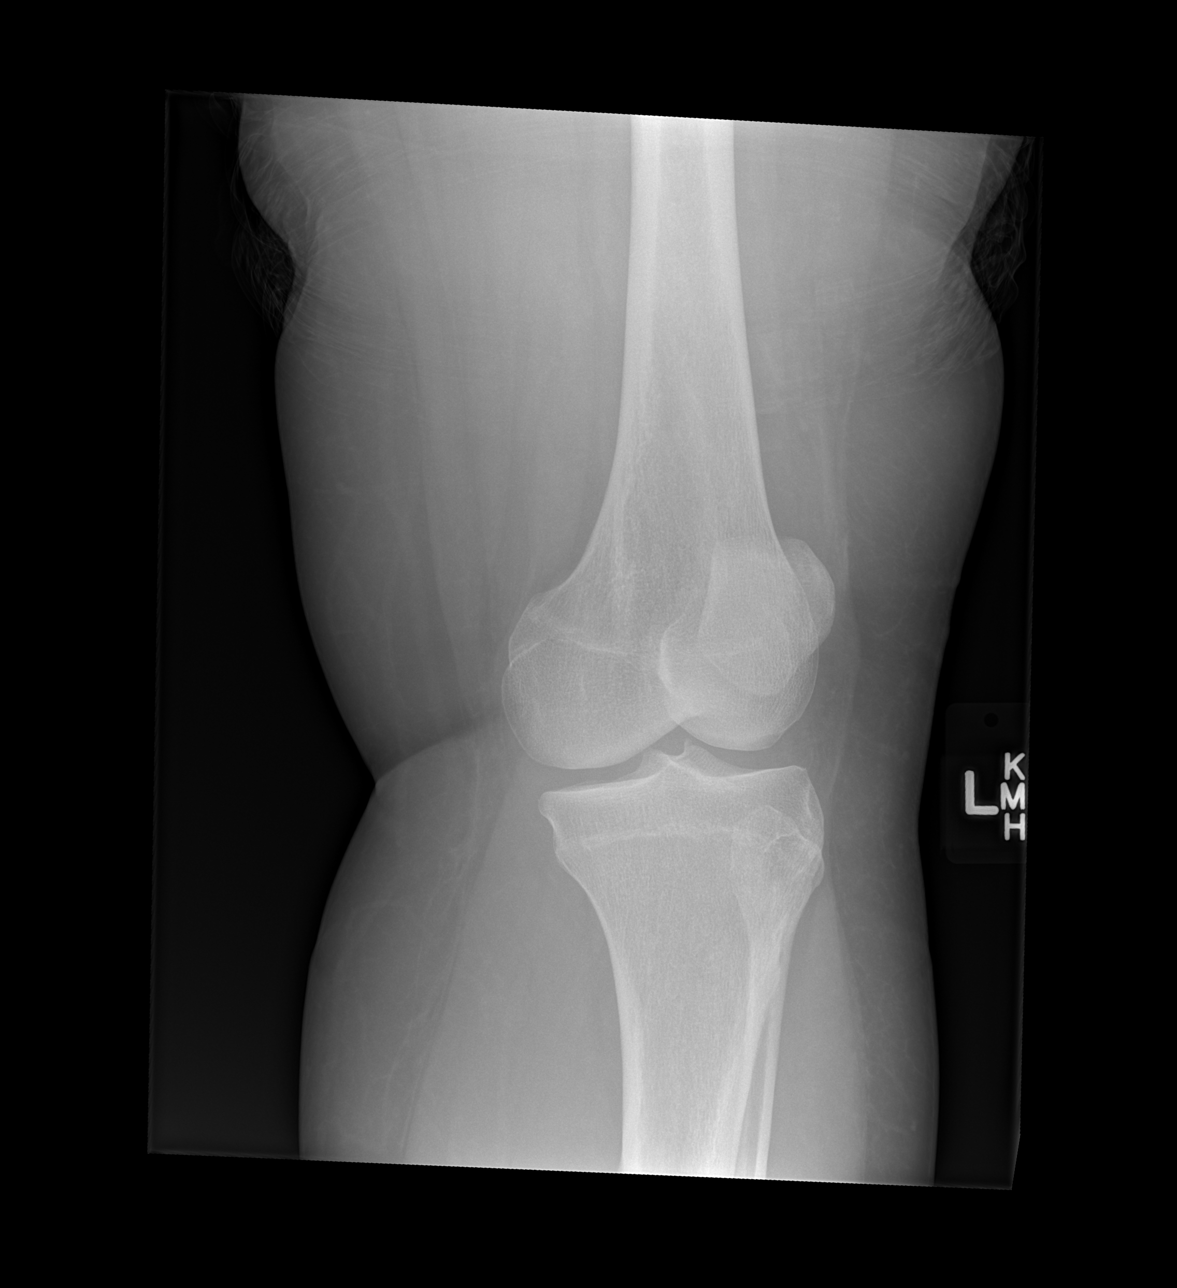

[x knee ap left (3 of 3)]
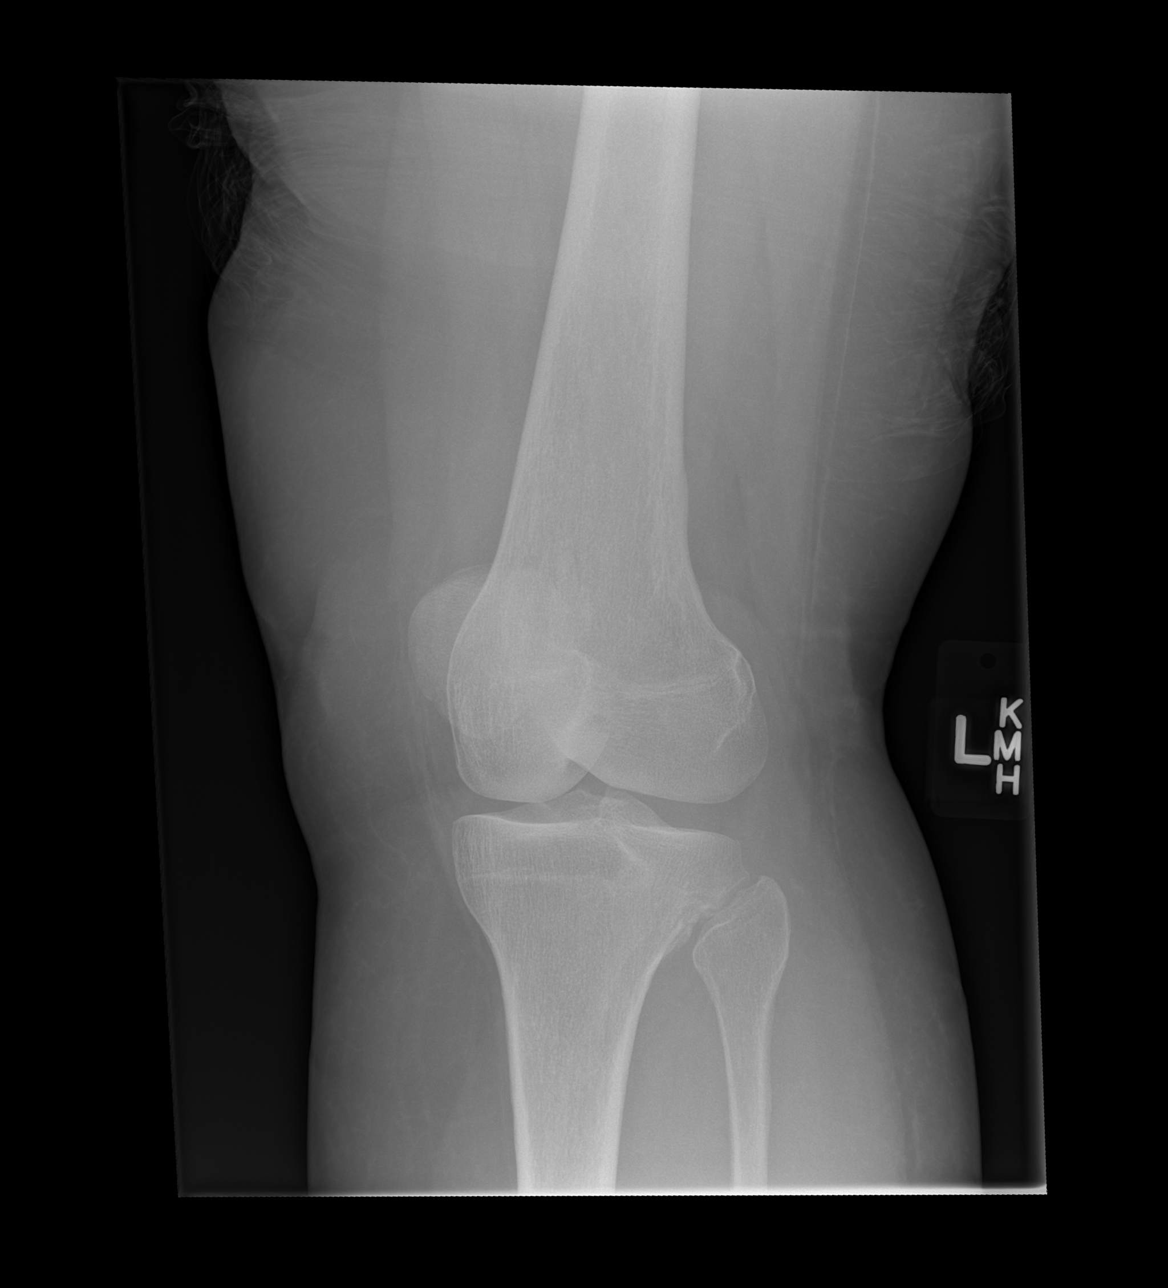

[x knee lat left]
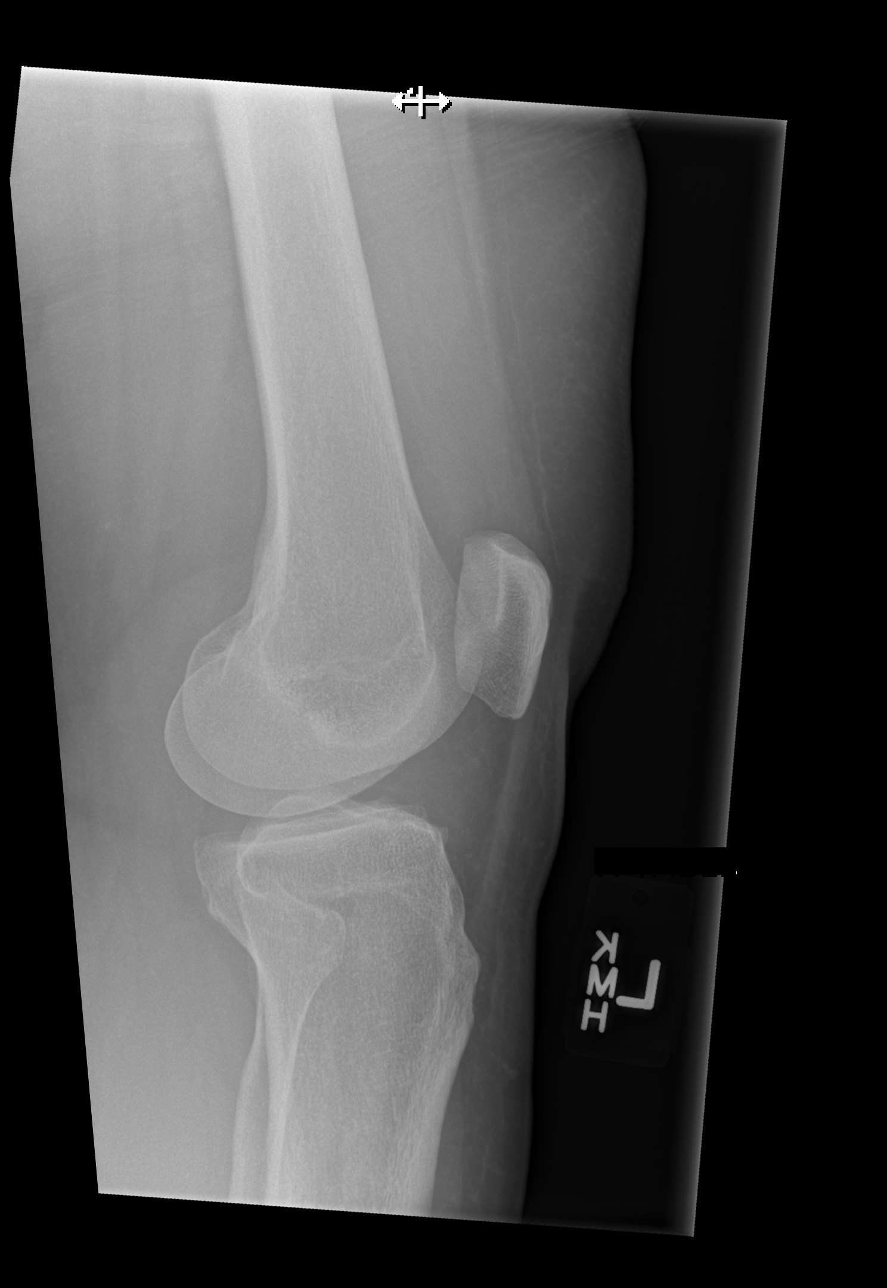

[4 of 4 positions shown; findings below may reference images not displayed]

FINDINGS: There is no evidence of fracture, dislocation, or joint effusion.
There is no evidence of arthropathy or other focal bone abnormality.
Soft tissues are unremarkable.
IMPRESSION: Negative.
